# Patient Record
Sex: Female | Born: 1935 | Race: Black or African American | Hispanic: No | Marital: Single | Smoking: Never smoker
Health system: Southern US, Community
[De-identification: ages and names within clinical notes are randomized; demographics above are authoritative.]

## PROBLEM LIST (undated history)

## (undated) DIAGNOSIS — I429 Cardiomyopathy, unspecified: Secondary | ICD-10-CM

## (undated) DIAGNOSIS — I8390 Asymptomatic varicose veins of unspecified lower extremity: Secondary | ICD-10-CM

## (undated) DIAGNOSIS — I10 Essential (primary) hypertension: Secondary | ICD-10-CM

## (undated) DIAGNOSIS — E119 Type 2 diabetes mellitus without complications: Secondary | ICD-10-CM

## (undated) DIAGNOSIS — I509 Heart failure, unspecified: Secondary | ICD-10-CM

## (undated) HISTORY — PX: PARTIAL HYSTERECTOMY: SHX80

## (undated) HISTORY — PX: NECK SURGERY: SHX720

---

## 2006-03-22 ENCOUNTER — Ambulatory Visit: Payer: Self-pay | Admitting: Gastroenterology

## 2012-01-16 ENCOUNTER — Encounter (INDEPENDENT_AMBULATORY_CARE_PROVIDER_SITE_OTHER): Payer: Self-pay

## 2016-10-16 DIAGNOSIS — H401131 Primary open-angle glaucoma, bilateral, mild stage: Secondary | ICD-10-CM | POA: Diagnosis not present

## 2016-10-23 DIAGNOSIS — I493 Ventricular premature depolarization: Secondary | ICD-10-CM | POA: Diagnosis not present

## 2016-10-23 DIAGNOSIS — I5189 Other ill-defined heart diseases: Secondary | ICD-10-CM | POA: Diagnosis not present

## 2016-10-23 DIAGNOSIS — I517 Cardiomegaly: Secondary | ICD-10-CM | POA: Diagnosis not present

## 2016-10-23 DIAGNOSIS — I10 Essential (primary) hypertension: Secondary | ICD-10-CM | POA: Diagnosis not present

## 2016-11-27 DIAGNOSIS — I361 Nonrheumatic tricuspid (valve) insufficiency: Secondary | ICD-10-CM | POA: Diagnosis not present

## 2016-11-27 DIAGNOSIS — Z79899 Other long term (current) drug therapy: Secondary | ICD-10-CM | POA: Diagnosis not present

## 2016-11-27 DIAGNOSIS — I371 Nonrheumatic pulmonary valve insufficiency: Secondary | ICD-10-CM | POA: Diagnosis not present

## 2016-11-27 DIAGNOSIS — E782 Mixed hyperlipidemia: Secondary | ICD-10-CM | POA: Diagnosis not present

## 2016-11-27 DIAGNOSIS — I340 Nonrheumatic mitral (valve) insufficiency: Secondary | ICD-10-CM | POA: Diagnosis not present

## 2016-11-27 DIAGNOSIS — R7303 Prediabetes: Secondary | ICD-10-CM | POA: Diagnosis not present

## 2016-11-27 DIAGNOSIS — I10 Essential (primary) hypertension: Secondary | ICD-10-CM | POA: Diagnosis not present

## 2016-11-29 DIAGNOSIS — H401131 Primary open-angle glaucoma, bilateral, mild stage: Secondary | ICD-10-CM | POA: Diagnosis not present

## 2016-12-03 DIAGNOSIS — Z79899 Other long term (current) drug therapy: Secondary | ICD-10-CM | POA: Diagnosis not present

## 2016-12-03 DIAGNOSIS — R7303 Prediabetes: Secondary | ICD-10-CM | POA: Diagnosis not present

## 2016-12-03 DIAGNOSIS — Z1211 Encounter for screening for malignant neoplasm of colon: Secondary | ICD-10-CM | POA: Diagnosis not present

## 2016-12-03 DIAGNOSIS — E782 Mixed hyperlipidemia: Secondary | ICD-10-CM | POA: Diagnosis not present

## 2016-12-03 DIAGNOSIS — I10 Essential (primary) hypertension: Secondary | ICD-10-CM | POA: Diagnosis not present

## 2017-01-15 DIAGNOSIS — H401131 Primary open-angle glaucoma, bilateral, mild stage: Secondary | ICD-10-CM | POA: Diagnosis not present

## 2017-05-23 DIAGNOSIS — Z1231 Encounter for screening mammogram for malignant neoplasm of breast: Secondary | ICD-10-CM | POA: Diagnosis not present

## 2017-05-23 DIAGNOSIS — Z803 Family history of malignant neoplasm of breast: Secondary | ICD-10-CM | POA: Diagnosis not present

## 2017-06-03 DIAGNOSIS — R7303 Prediabetes: Secondary | ICD-10-CM | POA: Diagnosis not present

## 2017-06-03 DIAGNOSIS — E782 Mixed hyperlipidemia: Secondary | ICD-10-CM | POA: Diagnosis not present

## 2017-06-03 DIAGNOSIS — Z79899 Other long term (current) drug therapy: Secondary | ICD-10-CM | POA: Diagnosis not present

## 2017-06-03 DIAGNOSIS — I10 Essential (primary) hypertension: Secondary | ICD-10-CM | POA: Diagnosis not present

## 2017-06-13 DIAGNOSIS — Z79899 Other long term (current) drug therapy: Secondary | ICD-10-CM | POA: Diagnosis not present

## 2017-06-13 DIAGNOSIS — E782 Mixed hyperlipidemia: Secondary | ICD-10-CM | POA: Diagnosis not present

## 2017-06-13 DIAGNOSIS — Z0000 Encounter for general adult medical examination without abnormal findings: Secondary | ICD-10-CM | POA: Diagnosis not present

## 2017-06-13 DIAGNOSIS — I10 Essential (primary) hypertension: Secondary | ICD-10-CM | POA: Diagnosis not present

## 2017-06-13 DIAGNOSIS — R7303 Prediabetes: Secondary | ICD-10-CM | POA: Diagnosis not present

## 2017-08-24 DIAGNOSIS — Z23 Encounter for immunization: Secondary | ICD-10-CM | POA: Diagnosis not present

## 2017-09-06 DIAGNOSIS — H401131 Primary open-angle glaucoma, bilateral, mild stage: Secondary | ICD-10-CM | POA: Diagnosis not present

## 2017-10-15 DIAGNOSIS — H401112 Primary open-angle glaucoma, right eye, moderate stage: Secondary | ICD-10-CM | POA: Diagnosis not present

## 2017-11-12 DIAGNOSIS — H401112 Primary open-angle glaucoma, right eye, moderate stage: Secondary | ICD-10-CM | POA: Diagnosis not present

## 2017-12-12 DIAGNOSIS — E782 Mixed hyperlipidemia: Secondary | ICD-10-CM | POA: Diagnosis not present

## 2017-12-12 DIAGNOSIS — I10 Essential (primary) hypertension: Secondary | ICD-10-CM | POA: Diagnosis not present

## 2017-12-12 DIAGNOSIS — Z79899 Other long term (current) drug therapy: Secondary | ICD-10-CM | POA: Diagnosis not present

## 2017-12-12 DIAGNOSIS — R7303 Prediabetes: Secondary | ICD-10-CM | POA: Diagnosis not present

## 2017-12-19 DIAGNOSIS — E782 Mixed hyperlipidemia: Secondary | ICD-10-CM | POA: Diagnosis not present

## 2017-12-19 DIAGNOSIS — Z79899 Other long term (current) drug therapy: Secondary | ICD-10-CM | POA: Diagnosis not present

## 2017-12-19 DIAGNOSIS — I10 Essential (primary) hypertension: Secondary | ICD-10-CM | POA: Diagnosis not present

## 2017-12-19 DIAGNOSIS — R7303 Prediabetes: Secondary | ICD-10-CM | POA: Diagnosis not present

## 2018-01-06 DIAGNOSIS — E785 Hyperlipidemia, unspecified: Secondary | ICD-10-CM | POA: Diagnosis not present

## 2018-01-06 DIAGNOSIS — I517 Cardiomegaly: Secondary | ICD-10-CM | POA: Diagnosis not present

## 2018-01-06 DIAGNOSIS — I493 Ventricular premature depolarization: Secondary | ICD-10-CM | POA: Diagnosis not present

## 2018-01-06 DIAGNOSIS — I10 Essential (primary) hypertension: Secondary | ICD-10-CM | POA: Diagnosis not present

## 2018-01-31 DIAGNOSIS — R410 Disorientation, unspecified: Secondary | ICD-10-CM | POA: Diagnosis not present

## 2018-01-31 DIAGNOSIS — I8392 Asymptomatic varicose veins of left lower extremity: Secondary | ICD-10-CM | POA: Diagnosis not present

## 2018-02-01 ENCOUNTER — Emergency Department: Payer: Medicare Other

## 2018-02-01 ENCOUNTER — Inpatient Hospital Stay
Admission: EM | Admit: 2018-02-01 | Discharge: 2018-02-04 | DRG: 683 | Disposition: A | Discharge status: Home / Self Care | Payer: Medicare Other | Attending: Internal Medicine | Admitting: Internal Medicine

## 2018-02-01 ENCOUNTER — Other Ambulatory Visit: Payer: Self-pay

## 2018-02-01 ENCOUNTER — Encounter: Payer: Self-pay | Admitting: Emergency Medicine

## 2018-02-01 DIAGNOSIS — R531 Weakness: Secondary | ICD-10-CM | POA: Diagnosis not present

## 2018-02-01 DIAGNOSIS — E876 Hypokalemia: Secondary | ICD-10-CM | POA: Diagnosis present

## 2018-02-01 DIAGNOSIS — N390 Urinary tract infection, site not specified: Secondary | ICD-10-CM | POA: Diagnosis present

## 2018-02-01 DIAGNOSIS — I429 Cardiomyopathy, unspecified: Secondary | ICD-10-CM | POA: Diagnosis not present

## 2018-02-01 DIAGNOSIS — S0990XA Unspecified injury of head, initial encounter: Secondary | ICD-10-CM | POA: Diagnosis not present

## 2018-02-01 DIAGNOSIS — R413 Other amnesia: Secondary | ICD-10-CM | POA: Diagnosis present

## 2018-02-01 DIAGNOSIS — I130 Hypertensive heart and chronic kidney disease with heart failure and stage 1 through stage 4 chronic kidney disease, or unspecified chronic kidney disease: Secondary | ICD-10-CM | POA: Diagnosis not present

## 2018-02-01 DIAGNOSIS — I509 Heart failure, unspecified: Secondary | ICD-10-CM | POA: Diagnosis present

## 2018-02-01 DIAGNOSIS — J40 Bronchitis, not specified as acute or chronic: Secondary | ICD-10-CM | POA: Diagnosis not present

## 2018-02-01 DIAGNOSIS — M79604 Pain in right leg: Secondary | ICD-10-CM | POA: Diagnosis not present

## 2018-02-01 DIAGNOSIS — N189 Chronic kidney disease, unspecified: Secondary | ICD-10-CM | POA: Diagnosis present

## 2018-02-01 DIAGNOSIS — D61818 Other pancytopenia: Secondary | ICD-10-CM | POA: Diagnosis present

## 2018-02-01 DIAGNOSIS — R740 Nonspecific elevation of levels of transaminase and lactic acid dehydrogenase [LDH]: Secondary | ICD-10-CM | POA: Diagnosis present

## 2018-02-01 DIAGNOSIS — R4182 Altered mental status, unspecified: Secondary | ICD-10-CM | POA: Diagnosis not present

## 2018-02-01 DIAGNOSIS — E860 Dehydration: Secondary | ICD-10-CM | POA: Diagnosis present

## 2018-02-01 DIAGNOSIS — E119 Type 2 diabetes mellitus without complications: Secondary | ICD-10-CM | POA: Diagnosis not present

## 2018-02-01 DIAGNOSIS — E1122 Type 2 diabetes mellitus with diabetic chronic kidney disease: Secondary | ICD-10-CM | POA: Diagnosis present

## 2018-02-01 DIAGNOSIS — N179 Acute kidney failure, unspecified: Secondary | ICD-10-CM | POA: Diagnosis not present

## 2018-02-01 DIAGNOSIS — R161 Splenomegaly, not elsewhere classified: Secondary | ICD-10-CM

## 2018-02-01 DIAGNOSIS — R4189 Other symptoms and signs involving cognitive functions and awareness: Secondary | ICD-10-CM | POA: Diagnosis present

## 2018-02-01 DIAGNOSIS — K759 Inflammatory liver disease, unspecified: Secondary | ICD-10-CM

## 2018-02-01 DIAGNOSIS — S199XXA Unspecified injury of neck, initial encounter: Secondary | ICD-10-CM | POA: Diagnosis not present

## 2018-02-01 DIAGNOSIS — K760 Fatty (change of) liver, not elsewhere classified: Secondary | ICD-10-CM | POA: Diagnosis present

## 2018-02-01 HISTORY — DX: Asymptomatic varicose veins of unspecified lower extremity: I83.90

## 2018-02-01 HISTORY — DX: Cardiomyopathy, unspecified: I42.9

## 2018-02-01 HISTORY — DX: Heart failure, unspecified: I50.9

## 2018-02-01 HISTORY — DX: Type 2 diabetes mellitus without complications: E11.9

## 2018-02-01 HISTORY — DX: Essential (primary) hypertension: I10

## 2018-02-01 LAB — CBC
HEMATOCRIT: 37.1 % (ref 35.0–47.0)
Hemoglobin: 12.2 g/dL (ref 12.0–16.0)
MCH: 30.2 pg (ref 26.0–34.0)
MCHC: 32.9 g/dL (ref 32.0–36.0)
MCV: 91.7 fL (ref 80.0–100.0)
Platelets: 98 10*3/uL — ABNORMAL LOW (ref 150–440)
RBC: 4.05 MIL/uL (ref 3.80–5.20)
RDW: 15 % — AB (ref 11.5–14.5)
WBC: 3.3 10*3/uL — ABNORMAL LOW (ref 3.6–11.0)

## 2018-02-01 LAB — URINALYSIS, COMPLETE (UACMP) WITH MICROSCOPIC
Bilirubin Urine: NEGATIVE
Glucose, UA: NEGATIVE mg/dL
HGB URINE DIPSTICK: NEGATIVE
Ketones, ur: NEGATIVE mg/dL
Leukocytes, UA: NEGATIVE
NITRITE: NEGATIVE
PROTEIN: NEGATIVE mg/dL
SPECIFIC GRAVITY, URINE: 1.01 (ref 1.005–1.030)
pH: 5 (ref 5.0–8.0)

## 2018-02-01 LAB — BASIC METABOLIC PANEL
ANION GAP: 10 (ref 5–15)
BUN: 30 mg/dL — AB (ref 6–20)
CALCIUM: 8.6 mg/dL — AB (ref 8.9–10.3)
CO2: 22 mmol/L (ref 22–32)
Chloride: 103 mmol/L (ref 101–111)
Creatinine, Ser: 2.53 mg/dL — ABNORMAL HIGH (ref 0.44–1.00)
GFR, EST AFRICAN AMERICAN: 20 mL/min — AB (ref >60)
GFR, EST NON AFRICAN AMERICAN: 17 mL/min — AB (ref >60)
Glucose, Bld: 157 mg/dL — ABNORMAL HIGH (ref 65–99)
POTASSIUM: 3.5 mmol/L (ref 3.5–5.1)
SODIUM: 135 mmol/L (ref 135–145)

## 2018-02-01 LAB — TROPONIN I

## 2018-02-01 MED ORDER — SODIUM CHLORIDE 0.9 % IV BOLUS
1000.0000 mL | Freq: Once | INTRAVENOUS | Status: AC
  Administered 2018-02-01: 1000 mL via INTRAVENOUS

## 2018-02-01 MED ORDER — SODIUM CHLORIDE 0.9 % IV SOLN
1.0000 g | INTRAVENOUS | Status: DC
  Administered 2018-02-01: 1 g via INTRAVENOUS
  Filled 2018-02-01: qty 1
  Filled 2018-02-01: qty 10

## 2018-02-01 MED ORDER — ONDANSETRON HCL 4 MG/2ML IJ SOLN: 4.0000 mg | Freq: Four times a day (QID) | INTRAMUSCULAR | Status: DC | PRN

## 2018-02-01 MED ORDER — METOPROLOL TARTRATE 25 MG PO TABS
25.0000 mg | ORAL_TABLET | Freq: Two times a day (BID) | ORAL | Status: DC
  Administered 2018-02-02 – 2018-02-04 (×5): 25 mg via ORAL
  Filled 2018-02-01 (×5): qty 1

## 2018-02-01 MED ORDER — AMLODIPINE BESYLATE 5 MG PO TABS
5.0000 mg | ORAL_TABLET | Freq: Every day | ORAL | Status: DC
  Administered 2018-02-02 – 2018-02-04 (×3): 5 mg via ORAL
  Filled 2018-02-01 (×3): qty 1

## 2018-02-01 MED ORDER — HEPARIN SODIUM (PORCINE) 5000 UNIT/ML IJ SOLN
5000.0000 [IU] | Freq: Three times a day (TID) | INTRAMUSCULAR | Status: DC
  Administered 2018-02-01 – 2018-02-03 (×5): 5000 [IU] via SUBCUTANEOUS
  Filled 2018-02-01 (×5): qty 1

## 2018-02-01 MED ORDER — INSULIN ASPART 100 UNIT/ML ~~LOC~~ SOLN
0.0000 [IU] | Freq: Three times a day (TID) | SUBCUTANEOUS | Status: DC
  Filled 2018-02-01: qty 1

## 2018-02-01 MED ORDER — ASPIRIN EC 81 MG PO TBEC
81.0000 mg | DELAYED_RELEASE_TABLET | Freq: Every day | ORAL | Status: DC
  Administered 2018-02-01 – 2018-02-04 (×4): 81 mg via ORAL
  Filled 2018-02-01 (×4): qty 1

## 2018-02-01 MED ORDER — BISACODYL 10 MG RE SUPP
10.0000 mg | Freq: Every day | RECTAL | Status: DC | PRN
  Filled 2018-02-01: qty 1

## 2018-02-01 MED ORDER — ACETAMINOPHEN 325 MG PO TABS
650.0000 mg | ORAL_TABLET | Freq: Four times a day (QID) | ORAL | Status: DC | PRN
  Administered 2018-02-02 – 2018-02-03 (×2): 650 mg via ORAL
  Filled 2018-02-01 (×2): qty 2

## 2018-02-01 MED ORDER — PANTOPRAZOLE SODIUM 40 MG PO TBEC
40.0000 mg | DELAYED_RELEASE_TABLET | Freq: Every day | ORAL | Status: DC
  Administered 2018-02-01 – 2018-02-04 (×4): 40 mg via ORAL
  Filled 2018-02-01 (×4): qty 1

## 2018-02-01 MED ORDER — ACETAMINOPHEN 650 MG RE SUPP: 650.0000 mg | Freq: Four times a day (QID) | RECTAL | Status: DC | PRN

## 2018-02-01 MED ORDER — ONDANSETRON HCL 4 MG PO TABS: 4.0000 mg | ORAL_TABLET | Freq: Four times a day (QID) | ORAL | Status: DC | PRN

## 2018-02-01 MED ORDER — SODIUM CHLORIDE 0.9 % IV SOLN
Freq: Once | INTRAVENOUS | Status: AC
  Administered 2018-02-01: 18:00:00 via INTRAVENOUS

## 2018-02-01 MED ORDER — DOCUSATE SODIUM 100 MG PO CAPS
100.0000 mg | ORAL_CAPSULE | Freq: Two times a day (BID) | ORAL | Status: DC
  Administered 2018-02-01 – 2018-02-04 (×6): 100 mg via ORAL
  Filled 2018-02-01 (×6): qty 1

## 2018-02-01 MED ORDER — POTASSIUM CHLORIDE IN NACL 20-0.9 MEQ/L-% IV SOLN
INTRAVENOUS | Status: AC
  Administered 2018-02-01 – 2018-02-02 (×2): via INTRAVENOUS
  Filled 2018-02-01 (×3): qty 1000

## 2018-02-01 NOTE — H&P (Signed)
History and Physical    Melissa Vazquez ZOX:[id number] DOB: March 03, 1936 DOA: 02/01/2018  Referring physician: Dr. Roxan Hockey PCP: [facility]  Specialists: none  Chief Complaint: confusion and weakness  HPI: Melissa Vazquez is a 82 y.o. female has a past medical history significant for DM and HTN now with worsening confusion with weakness and anorexia, In ER, pt noted to have AKI with low GFR. Also with possible UTI. Head CT OK. Sugars and BP stable. She is now admiited. Denies CP or SOB. No N/V/D.  Review of Systems: The patient denies , fever, weight loss,, vision loss, decreased hearing, hoarseness, chest pain, syncope, dyspnea on exertion, peripheral edema, balance deficits, hemoptysis, abdominal pain, melena, hematochezia, severe indigestion/heartburn, hematuria, incontinence, genital sores, muscle weakness, suspicious skin lesions, transient blindness, difficulty walking, depression, unusual weight change, abnormal bleeding, enlarged lymph nodes, angioedema, and breast masses.   Past Medical History:  Diagnosis Date  . Cardiomyopathy (HCC)   . CHF (congestive heart failure) (HCC)   . Diabetes mellitus without complication (HCC)    prediabetic  . Hypertension   . Varicose vein of leg    Past Surgical History:  Procedure Laterality Date  . NECK SURGERY     Social History:  reports that she has never smoked. She has never used smokeless tobacco. She reports that she does not drink alcohol or use drugs.  No Known Allergies  History reviewed. No pertinent family history.  Prior to Admission medications   Medication Sig Start Date End Date Taking? Authorizing Provider  amLODipine (NORVASC) 5 MG tablet Take 5 mg by mouth daily. 12/27/17  Yes [provider]  metoprolol tartrate (LOPRESSOR) 25 MG tablet Take 25 mg by mouth 2 (two) times daily. 12/27/17  Yes [provider]  potassium chloride (K-DUR) 10 MEQ tablet Take 10 mEq by mouth daily.  12/27/17  Yes [provider]  triamterene-hydrochlorothiazide (MAXZIDE-25) 37.5-25 MG tablet Take 1 tablet by mouth daily. 12/27/17  Yes [provider]   Physical Exam: Vitals:   02/01/18 1600 02/01/18 1630 02/01/18 1738 02/01/18 1800  BP: (!) 133/58 (!) 130/58 (!) 125/58 126/64  Pulse: 99  100 89  Resp: 20 20 (!) 27 15  Temp:      TempSrc:      SpO2: 100%  93% 97%  Weight:      Height:         General:  No apparent distress, WDWN, [place]/AT  Eyes: PERRL, EOMI, no scleral icterus, conjunctiva clear  ENT: moist oropharynx without exudate, TM's benign, dentition fair  Neck: supple, no lymphadenopathy. No bruits or thyromegaly  Cardiovascular: regular rate without MRG; 2+ peripheral pulses, no JVD, no peripheral edema  Respiratory: CTA biL, good air movement without wheezing, rhonchi or crackled. Respiratory effort normal  Abdomen: soft, non tender to palpation, positive bowel sounds, no guarding, no rebound  Skin: no rashes or lesions  Musculoskeletal: normal bulk and tone, no joint swelling  Psychiatric: normal mood and affect, A&OX1(not to time or place)  Neurologic: CN 2-12 grossly intact, Motor strength 5/5 in all 4 groups with symmetric DTR's and non-focal sensory exam  Labs on Admission:  Basic Metabolic Panel: Recent Labs  Lab 02/01/18 1144  NA 135  K 3.5  CL 103  CO2 22  GLUCOSE 157*  BUN 30*  CREATININE 2.53*  CALCIUM 8.6*   Liver Function Tests: No results for input(s): AST, ALT, ALKPHOS, BILITOT, PROT, ALBUMIN in the last 168 hours. No results  for input(s): LIPASE, AMYLASE in the last 168 hours. No results for input(s): AMMONIA in the last 168 hours. CBC: Recent Labs  Lab 02/01/18 1144  WBC 3.3*  HGB 12.2  HCT 37.1  MCV 91.7  PLT 98*   Cardiac Enzymes: Recent Labs  Lab 02/01/18 1144  TROPONINI <0.03    BNP (last 3 results) No results for input(s): BNP in the last 8760 hours.  ProBNP (last 3 results) No results for  input(s): PROBNP in the last 8760 hours.  CBG: No results for input(s): GLUCAP in the last 168 hours.  Radiological Exams on Admission: Dg Chest 2 View  Result Date: 02/01/2018 CLINICAL DATA:  Increasing weakness for 3 days, unable to stand, history CHF, hypertension EXAM: CHEST - 2 VIEW COMPARISON:  None FINDINGS: Normal heart size, mediastinal contours, and pulmonary vascularity. Atherosclerotic calcification aorta. Mild central peribronchial thickening. Lungs otherwise clear. No infiltrate, pleural effusion or pneumothorax. Multilevel endplate spur formation thoracic spine. IMPRESSION: Mild bronchitic changes without acute infiltrate. Electronically Signed   By: Ulyses Southward M.D.   On: 02/01/2018 13:38   Ct Head Wo Contrast  Result Date: 02/01/2018 CLINICAL DATA:  82 year old female with head and neck injury following fall several nights ago. Initial encounter. EXAM: CT HEAD WITHOUT CONTRAST CT CERVICAL SPINE WITHOUT CONTRAST TECHNIQUE: Multidetector CT imaging of the head and cervical spine was performed following the standard protocol without intravenous contrast. Multiplanar CT image reconstructions of the cervical spine were also generated. COMPARISON:  None. FINDINGS: CT HEAD FINDINGS Brain: No evidence of acute infarction, hemorrhage, hydrocephalus, extra-axial collection or mass lesion/mass effect. Minimal chronic small-vessel white matter ischemic changes noted. Vascular: Atherosclerotic calcifications identified. Skull: No acute abnormality Sinuses/Orbits: No acute abnormality Other: None CT CERVICAL SPINE FINDINGS Alignment: Normal. Skull base and vertebrae: No acute fracture. No primary bone lesion or focal pathologic process. Soft tissues and spinal canal: No prevertebral fluid or swelling. No visible canal hematoma. Disc levels: Moderate multilevel degenerative disc disease and spondylosis throughout the cervical spine noted contributing to central spinal and bony foraminal narrowing at  multiple levels. Upper chest: No acute abnormality Other: None IMPRESSION: 1. No evidence of acute intracranial abnormality. Minimal chronic small-vessel white matter ischemic changes. 2. No static evidence of acute injury to the cervical spine. Moderate multilevel degenerative disc disease/spondylosis. Electronically Signed   By: Harmon Pier M.D.   On: 02/01/2018 17:33   Ct Cervical Spine Wo Contrast  Result Date: 02/01/2018 CLINICAL DATA:  82 year old female with head and neck injury following fall several nights ago. Initial encounter. EXAM: CT HEAD WITHOUT CONTRAST CT CERVICAL SPINE WITHOUT CONTRAST TECHNIQUE: Multidetector CT imaging of the head and cervical spine was performed following the standard protocol without intravenous contrast. Multiplanar CT image reconstructions of the cervical spine were also generated. COMPARISON:  None. FINDINGS: CT HEAD FINDINGS Brain: No evidence of acute infarction, hemorrhage, hydrocephalus, extra-axial collection or mass lesion/mass effect. Minimal chronic small-vessel white matter ischemic changes noted. Vascular: Atherosclerotic calcifications identified. Skull: No acute abnormality Sinuses/Orbits: No acute abnormality Other: None CT CERVICAL SPINE FINDINGS Alignment: Normal. Skull base and vertebrae: No acute fracture. No primary bone lesion or focal pathologic process. Soft tissues and spinal canal: No prevertebral fluid or swelling. No visible canal hematoma. Disc levels: Moderate multilevel degenerative disc disease and spondylosis throughout the cervical spine noted contributing to central spinal and bony foraminal narrowing at multiple levels. Upper chest: No acute abnormality Other: None IMPRESSION: 1. No evidence of acute intracranial abnormality. Minimal chronic small-vessel white matter  ischemic changes. 2. No static evidence of acute injury to the cervical spine. Moderate multilevel degenerative disc disease/spondylosis. Electronically Signed   By: Harmon Pier M.D.   On: 02/01/2018 17:33   US Venous Img Lower Unilateral Right  Result Date: 02/01/2018 CLINICAL DATA:  RIGHT leg pain for 2 months EXAM: RIGHT LOWER EXTREMITY VENOUS DOPPLER ULTRASOUND TECHNIQUE: Gray-scale sonography with graded compression, as well as color Doppler and duplex ultrasound were performed to evaluate the lower extremity deep venous systems from the level of the common femoral vein and including the common femoral, femoral, profunda femoral, popliteal and calf veins including the posterior tibial, peroneal and gastrocnemius veins when visible. The superficial great saphenous vein was also interrogated. Spectral Doppler was utilized to evaluate flow at rest and with distal augmentation maneuvers in the common femoral, femoral and popliteal veins. COMPARISON:  None FINDINGS: Contralateral Common Femoral Vein: Respiratory phasicity is normal and symmetric with the symptomatic side. No evidence of thrombus. Normal compressibility. Common Femoral Vein: No evidence of thrombus. Normal compressibility, respiratory phasicity and response to augmentation. Saphenofemoral Junction: No evidence of thrombus. Normal compressibility and flow on color Doppler imaging. Profunda Femoral Vein: No evidence of thrombus. Normal compressibility and flow on color Doppler imaging. Femoral Vein: No evidence of thrombus. Normal compressibility, respiratory phasicity and response to augmentation. Popliteal Vein: No evidence of thrombus. Normal compressibility, respiratory phasicity and response to augmentation. Calf Veins: No evidence of thrombus. Normal compressibility and flow on color Doppler imaging. Superficial Great Saphenous Vein: No evidence of thrombus. Normal compressibility. Venous Reflux:  None. Other Findings:  None. IMPRESSION: No evidence of deep venous thrombosis in the RIGHT lower extremity. Electronically Signed   By: Ulyses Southward M.D.   On: 02/01/2018 13:58    EKG: Independently  reviewed.  Assessment/Plan Principal Problem:   AKI (acute kidney injury) (HCC) Active Problems:   Altered mental status, unspecified   UTI (urinary tract infection)   Diabetes (HCC)   Will observe on floor with IV fluids and empiric IV ABX. Cultures sent. Follow BP and sugars. Renal US ordered. Consult Neurology. Repeat labs in AM.  Diet: soft Fluids: NS@75  DVT Prophylaxis: SQ Heparin  Code Status: FULL  Family Communication: yes  Disposition Plan: home  Time spent: 50 min

## 2018-02-01 NOTE — ED Triage Notes (Signed)
Arrives c/o 3 day history of decreased appetite and 2 day history of fatigue.  Patient has history of HTN, and son reports that patient's BP this morning was 101/58.  Patient took BP meds yesterday.

## 2018-02-01 NOTE — ED Notes (Signed)
Report from alicia, rn.  

## 2018-02-01 NOTE — ED Provider Notes (Signed)
[facility] Emergency Department Provider Note    First MD Initiated Contact with Patient 02/01/18 1242     (approximate)  I have reviewed the triage vital signs and the nursing notes.   HISTORY  Chief Complaint confusion   HPI Melissa Vazquez is a 82 y.o. female presents to the ER with history of congestive heart failure as well as diabetes and hypertension with recent escalation of blood pressure medications with worsening weakness drowsiness and confusion.  Family at bedside states that where the patient's blood pressure was too low it was down in the low 100s and she typically runs in the 140s 150s.  Patient denies any pain.  They deny any recent trauma.  No fevers.  No abdominal pain.  No narcotic medications.  No history of dementia or stroke.  Past Medical History:  Diagnosis Date  . Cardiomyopathy (HCC)   . CHF (congestive heart failure) (HCC)   . Diabetes mellitus without complication (HCC)    prediabetic  . Hypertension   . Varicose vein of leg    No family history on file. Past Surgical History:  Procedure Laterality Date  . NECK SURGERY     There are no active problems to display for this patient.     Prior to Admission medications   Not on File    Allergies Patient has no known allergies.    Social History Social History   Tobacco Use  . Smoking status: Never Smoker  . Smokeless tobacco: Never Used  Substance Use Topics  . Alcohol use: Never    Frequency: Never  . Drug use: Never    Review of Systems Patient denies headaches, rhinorrhea, blurry vision, numbness, shortness of breath, chest pain, edema, cough, abdominal pain, nausea, vomiting, diarrhea, dysuria, fevers, rashes or hallucinations unless otherwise stated above in HPI. ____________________________________________   PHYSICAL EXAM:  VITAL SIGNS: Vitals:   02/01/18 1136  BP: (!) 109/44  Pulse: (!) 104  Resp: 16  Temp: 98.8 F (37.1 C)  SpO2: 96%     Constitutional: drowsy, frail appearing but in no acute distress. Eyes: Conjunctivae are normal.  Head: Atraumatic. Nose: No congestion/rhinnorhea. Mouth/Throat: Mucous membranes are moist.   Neck: No stridor. Painless ROM.  Cardiovascular: Normal rate, regular rhythm. Grossly normal heart sounds.  Good peripheral circulation. Respiratory: Normal respiratory effort.  No retractions. Lungs CTAB. Gastrointestinal: Soft and nontender. No distention. No abdominal bruits. No CVA tenderness. Genitourinary:  Musculoskeletal: No lower extremity tenderness nor edema.  No joint effusions. Neurologic:  Normal speech and language. No gross focal neurologic deficits are appreciated. No facial droop Skin:  Skin is warm, dry and intact. No rash noted.  ____________________________________________   LABS (all labs ordered are listed, but only abnormal results are displayed)  Results for orders placed or performed during the hospital encounter of 02/01/18 (from the past 24 hour(s))  Basic metabolic panel     Status: Abnormal   Collection Time: 02/01/18 11:44 AM  Result Value Ref Range   Sodium 135 135 - 145 mmol/L   Potassium 3.5 3.5 - 5.1 mmol/L   Chloride 103 101 - 111 mmol/L   CO2 22 22 - 32 mmol/L   Glucose, Bld 157 (H) 65 - 99 mg/dL   BUN 30 (H) 6 - 20 mg/dL   Creatinine, Ser 8.29 (H) 0.44 - 1.00 mg/dL   Calcium 8.6 (L) 8.9 - 10.3 mg/dL   GFR calc non Af Amer 17 (L) >60 mL/min   GFR calc  Af Amer 20 (L) >60 mL/min   Anion gap 10 5 - 15  CBC     Status: Abnormal   Collection Time: 02/01/18 11:44 AM  Result Value Ref Range   WBC 3.3 (L) 3.6 - 11.0 K/uL   RBC 4.05 3.80 - 5.20 MIL/uL   Hemoglobin 12.2 12.0 - 16.0 g/dL   HCT 16.1 09.6 - 04.5 %   MCV 91.7 80.0 - 100.0 fL   MCH 30.2 26.0 - 34.0 pg   MCHC 32.9 32.0 - 36.0 g/dL   RDW 40.9 (H) 81.1 - 91.4 %   Platelets 98 (L) 150 - 440 K/uL  Troponin I     Status: None   Collection Time: 02/01/18 11:44 AM  Result Value Ref Range    Troponin I <0.03 <0.03 ng/mL   ____________________________________________  EKG My review and personal interpretation at Time: 14:17   Indication: weakness  Rate: 90  Rhythm: sinus Axis: normal Other: normal intervals, no stemi, occasional pvc ____________________________________________  RADIOLOGY  I personally reviewed all radiographic images ordered to evaluate for the above acute complaints and reviewed radiology reports and findings.  These findings were personally discussed with the patient.  Please see medical record for radiology report.  ____________________________________________   PROCEDURES  Procedure(s) performed:  Procedures    Critical Care performed: no ____________________________________________   INITIAL IMPRESSION / ASSESSMENT AND PLAN / ED COURSE  Pertinent labs & imaging results that were available during my care of the patient were reviewed by me and considered in my medical decision making (see chart for details).  DDX: Dehydration, sepsis, pna, uti, hypoglycemia, cva, drug effect, withdrawal, encephalitis   Melissa Vazquez is a 82 y.o. who presents to the ED with symptoms as described above.  Very vague symptoms with no lateralizing features on exam.  Blood work will be sent for the above differential.  Chest x-ray shows no evidence of pneumonia.  Will provide IV fluids as her laboratory data does show evidence of CKD.  Uncertain of chronicity of this.  Certainly possible this is acute but I cannot confirm this.  Patient denies any pain or nausea.  Clinical Course as of Feb 01 2034  Sat Feb 01, 2018  1610 Patient reassessed and still drowsy and encephalopathic.  Awaiting urine.  Family now states that they did remember her falling 2 days ago but denied hitting her head.  Due to her encephalopathy will order CT imaging.   [PR]  1840 Does have rare bacteria but no nitrites or leukocytes.  Patient still drowsy and fall asleep.  Seems primarily  metabolic.  Might be secondary to possible AKI uncertain as unable to obtain baseline.  Patient does feel very weak with standing.  No evidence of head injury.  Seems less likely CVA.  May be secondary to hypotension for her as she does typically run in the 150s.   [PR]    Clinical Course User Index [PR] Willy Eddy, MD     As part of my medical decision making, I reviewed the following data within the electronic MEDICAL RECORD NUMBER Nursing notes reviewed and incorporated, Labs reviewed, notes from prior ED visits and [place] Controlled Substance Database   ____________________________________________   FINAL CLINICAL IMPRESSION(S) / ED DIAGNOSES  Final diagnoses:  Weakness  Altered mental status, unspecified altered mental status type      NEW MEDICATIONS STARTED DURING THIS VISIT:  New Prescriptions   No medications on file     Note:  This document was  prepared using Conservation officer, historic buildings and may include unintentional dictation errors.    Willy Eddy, MD 02/01/18 2038

## 2018-02-02 ENCOUNTER — Observation Stay: Payer: Medicare Other

## 2018-02-02 ENCOUNTER — Inpatient Hospital Stay: Payer: Medicare Other

## 2018-02-02 DIAGNOSIS — N179 Acute kidney failure, unspecified: Secondary | ICD-10-CM | POA: Diagnosis not present

## 2018-02-02 DIAGNOSIS — R740 Nonspecific elevation of levels of transaminase and lactic acid dehydrogenase [LDH]: Secondary | ICD-10-CM | POA: Diagnosis present

## 2018-02-02 DIAGNOSIS — R509 Fever, unspecified: Secondary | ICD-10-CM | POA: Diagnosis not present

## 2018-02-02 DIAGNOSIS — N390 Urinary tract infection, site not specified: Secondary | ICD-10-CM | POA: Diagnosis present

## 2018-02-02 DIAGNOSIS — I509 Heart failure, unspecified: Secondary | ICD-10-CM | POA: Diagnosis present

## 2018-02-02 DIAGNOSIS — D696 Thrombocytopenia, unspecified: Secondary | ICD-10-CM | POA: Diagnosis not present

## 2018-02-02 DIAGNOSIS — M79604 Pain in right leg: Secondary | ICD-10-CM | POA: Diagnosis present

## 2018-02-02 DIAGNOSIS — R4182 Altered mental status, unspecified: Secondary | ICD-10-CM | POA: Diagnosis not present

## 2018-02-02 DIAGNOSIS — E860 Dehydration: Secondary | ICD-10-CM | POA: Diagnosis not present

## 2018-02-02 DIAGNOSIS — K760 Fatty (change of) liver, not elsewhere classified: Secondary | ICD-10-CM | POA: Diagnosis present

## 2018-02-02 DIAGNOSIS — I429 Cardiomyopathy, unspecified: Secondary | ICD-10-CM | POA: Diagnosis present

## 2018-02-02 DIAGNOSIS — R410 Disorientation, unspecified: Secondary | ICD-10-CM | POA: Diagnosis not present

## 2018-02-02 DIAGNOSIS — N189 Chronic kidney disease, unspecified: Secondary | ICD-10-CM | POA: Diagnosis present

## 2018-02-02 DIAGNOSIS — R531 Weakness: Secondary | ICD-10-CM | POA: Diagnosis not present

## 2018-02-02 DIAGNOSIS — R413 Other amnesia: Secondary | ICD-10-CM | POA: Diagnosis present

## 2018-02-02 DIAGNOSIS — R4189 Other symptoms and signs involving cognitive functions and awareness: Secondary | ICD-10-CM | POA: Diagnosis present

## 2018-02-02 DIAGNOSIS — G9340 Encephalopathy, unspecified: Secondary | ICD-10-CM | POA: Diagnosis not present

## 2018-02-02 DIAGNOSIS — E876 Hypokalemia: Secondary | ICD-10-CM | POA: Diagnosis present

## 2018-02-02 DIAGNOSIS — D61818 Other pancytopenia: Secondary | ICD-10-CM | POA: Diagnosis present

## 2018-02-02 DIAGNOSIS — E119 Type 2 diabetes mellitus without complications: Secondary | ICD-10-CM | POA: Diagnosis not present

## 2018-02-02 DIAGNOSIS — J40 Bronchitis, not specified as acute or chronic: Secondary | ICD-10-CM | POA: Diagnosis present

## 2018-02-02 DIAGNOSIS — I130 Hypertensive heart and chronic kidney disease with heart failure and stage 1 through stage 4 chronic kidney disease, or unspecified chronic kidney disease: Secondary | ICD-10-CM | POA: Diagnosis present

## 2018-02-02 DIAGNOSIS — K7689 Other specified diseases of liver: Secondary | ICD-10-CM | POA: Diagnosis not present

## 2018-02-02 DIAGNOSIS — E1122 Type 2 diabetes mellitus with diabetic chronic kidney disease: Secondary | ICD-10-CM | POA: Diagnosis present

## 2018-02-02 DIAGNOSIS — D649 Anemia, unspecified: Secondary | ICD-10-CM | POA: Diagnosis not present

## 2018-02-02 LAB — GLUCOSE, CAPILLARY
GLUCOSE-CAPILLARY: 124 mg/dL — AB (ref 65–99)
Glucose-Capillary: 86 mg/dL (ref 65–99)

## 2018-02-02 LAB — CBC
HCT: 31.6 % — ABNORMAL LOW (ref 35.0–47.0)
Hemoglobin: 10.8 g/dL — ABNORMAL LOW (ref 12.0–16.0)
MCH: 30.8 pg (ref 26.0–34.0)
MCHC: 34.3 g/dL (ref 32.0–36.0)
MCV: 89.8 fL (ref 80.0–100.0)
Platelets: 72 10*3/uL — ABNORMAL LOW (ref 150–440)
RBC: 3.52 MIL/uL — AB (ref 3.80–5.20)
RDW: 15.2 % — ABNORMAL HIGH (ref 11.5–14.5)
WBC: 3.5 10*3/uL — ABNORMAL LOW (ref 3.6–11.0)

## 2018-02-02 LAB — COMPREHENSIVE METABOLIC PANEL
ALK PHOS: 69 U/L (ref 38–126)
ALT: 104 U/L — ABNORMAL HIGH (ref 14–54)
AST: 125 U/L — ABNORMAL HIGH (ref 15–41)
Albumin: 2.9 g/dL — ABNORMAL LOW (ref 3.5–5.0)
Anion gap: 6 (ref 5–15)
BUN: 29 mg/dL — ABNORMAL HIGH (ref 6–20)
CALCIUM: 7.7 mg/dL — AB (ref 8.9–10.3)
CO2: 23 mmol/L (ref 22–32)
Chloride: 108 mmol/L (ref 101–111)
Creatinine, Ser: 1.82 mg/dL — ABNORMAL HIGH (ref 0.44–1.00)
GFR calc Af Amer: 29 mL/min — ABNORMAL LOW (ref >60)
GFR calc non Af Amer: 25 mL/min — ABNORMAL LOW (ref >60)
Glucose, Bld: 94 mg/dL (ref 65–99)
Potassium: 3.4 mmol/L — ABNORMAL LOW (ref 3.5–5.1)
SODIUM: 137 mmol/L (ref 135–145)
Total Bilirubin: 0.6 mg/dL (ref 0.3–1.2)
Total Protein: 5.8 g/dL — ABNORMAL LOW (ref 6.5–8.1)

## 2018-02-02 LAB — AMMONIA: Ammonia: 40 umol/L — ABNORMAL HIGH (ref 9–35)

## 2018-02-02 LAB — VITAMIN B12: VITAMIN B 12: 1085 pg/mL — AB (ref 180–914)

## 2018-02-02 LAB — MAGNESIUM: Magnesium: 2.1 mg/dL (ref 1.7–2.4)

## 2018-02-02 LAB — PROCALCITONIN: PROCALCITONIN: 0.56 ng/mL

## 2018-02-02 LAB — TSH: TSH: 2.069 u[IU]/mL (ref 0.350–4.500)

## 2018-02-02 LAB — FOLATE: FOLATE: 37 ng/mL (ref >5.9)

## 2018-02-02 MED ORDER — POTASSIUM CHLORIDE 20 MEQ PO PACK
40.0000 meq | PACK | Freq: Once | ORAL | Status: AC
  Administered 2018-02-02: 09:00:00 40 meq via ORAL
  Filled 2018-02-02: qty 2

## 2018-02-02 NOTE — Progress Notes (Signed)
[employer] - [place] at [facility]   PATIENT NAME: Melissa Vazquez    MR#:  [id number]  DATE OF BIRTH:  17-Mar-1936  SUBJECTIVE:  CHIEF COMPLAINT:  No chief complaint on file. Patient with poor memory, numerous family members at the bedside  REVIEW OF SYSTEMS:  CONSTITUTIONAL: No fever, fatigue or weakness.  EYES: No blurred or double vision.  EARS, NOSE, AND THROAT: No tinnitus or ear pain.  RESPIRATORY: No cough, shortness of breath, wheezing or hemoptysis.  CARDIOVASCULAR: No chest pain, orthopnea, edema.  GASTROINTESTINAL: No nausea, vomiting, diarrhea or abdominal pain.  GENITOURINARY: No dysuria, hematuria.  ENDOCRINE: No polyuria, nocturia,  HEMATOLOGY: No anemia, easy bruising or bleeding SKIN: No rash or lesion. MUSCULOSKELETAL: No joint pain or arthritis.   NEUROLOGIC: No tingling, numbness, weakness.  PSYCHIATRY: No anxiety or depression.   ROS  DRUG ALLERGIES:  No Known Allergies  VITALS:  Blood pressure 130/62, pulse 94, temperature 98.5 F (36.9 C), temperature source Oral, resp. rate (!) 22, height 5' 5.5" (1.664 m), weight 83.9 kg (185 lb), SpO2 94 %.  PHYSICAL EXAMINATION:  GENERAL:  82 y.o.-year-old patient lying in the bed with no acute distress.  EYES: Pupils equal, round, reactive to light and accommodation. No scleral icterus. Extraocular muscles intact.  HEENT: Head atraumatic, normocephalic. Oropharynx and nasopharynx clear.  NECK:  Supple, no jugular venous distention. No thyroid enlargement, no tenderness.  LUNGS: Normal breath sounds bilaterally, no wheezing, rales,rhonchi or crepitation. No use of accessory muscles of respiration.  CARDIOVASCULAR: S1, S2 normal. No murmurs, rubs, or gallops.  ABDOMEN: Soft, nontender, nondistended. Bowel sounds present. No organomegaly or mass.  EXTREMITIES: No pedal edema, cyanosis, or clubbing.  NEUROLOGIC: Cranial nerves II through XII are intact. Muscle strength 5/5 in all extremities. Sensation  intact. Gait not checked.  PSYCHIATRIC: The patient is alert and oriented x 3.  SKIN: No obvious rash, lesion, or ulcer.   Physical Exam LABORATORY PANEL:   CBC Recent Labs  Lab 02/02/18 0452  WBC 3.5*  HGB 10.8*  HCT 31.6*  PLT 72*   ------------------------------------------------------------------------------------------------------------------  Chemistries  Recent Labs  Lab 02/02/18 0452 02/02/18 0847  NA 137  --   K 3.4*  --   CL 108  --   CO2 23  --   GLUCOSE 94  --   BUN 29*  --   CREATININE 1.82*  --   CALCIUM 7.7*  --   MG  --  2.1  AST 125*  --   ALT 104*  --   ALKPHOS 69  --   BILITOT 0.6  --    ------------------------------------------------------------------------------------------------------------------  Cardiac Enzymes Recent Labs  Lab 02/01/18 1144  TROPONINI <0.03   ------------------------------------------------------------------------------------------------------------------  RADIOLOGY:  Dg Chest 2 View  Result Date: 02/01/2018 CLINICAL DATA:  Increasing weakness for 3 days, unable to stand, history CHF, hypertension EXAM: CHEST - 2 VIEW COMPARISON:  None FINDINGS: Normal heart size, mediastinal contours, and pulmonary vascularity. Atherosclerotic calcification aorta. Mild central peribronchial thickening. Lungs otherwise clear. No infiltrate, pleural effusion or pneumothorax. Multilevel endplate spur formation thoracic spine. IMPRESSION: Mild bronchitic changes without acute infiltrate. Electronically Signed   By: Ulyses Southward M.D.   On: 02/01/2018 13:38   Ct Head Wo Contrast  Result Date: 02/01/2018 CLINICAL DATA:  82 year old female with head and neck injury following fall several nights ago. Initial encounter. EXAM: CT HEAD WITHOUT CONTRAST CT CERVICAL SPINE WITHOUT CONTRAST TECHNIQUE: Multidetector CT imaging of the head and cervical spine was performed following  the standard protocol without intravenous contrast. Multiplanar CT image  reconstructions of the cervical spine were also generated. COMPARISON:  None. FINDINGS: CT HEAD FINDINGS Brain: No evidence of acute infarction, hemorrhage, hydrocephalus, extra-axial collection or mass lesion/mass effect. Minimal chronic small-vessel white matter ischemic changes noted. Vascular: Atherosclerotic calcifications identified. Skull: No acute abnormality Sinuses/Orbits: No acute abnormality Other: None CT CERVICAL SPINE FINDINGS Alignment: Normal. Skull base and vertebrae: No acute fracture. No primary bone lesion or focal pathologic process. Soft tissues and spinal canal: No prevertebral fluid or swelling. No visible canal hematoma. Disc levels: Moderate multilevel degenerative disc disease and spondylosis throughout the cervical spine noted contributing to central spinal and bony foraminal narrowing at multiple levels. Upper chest: No acute abnormality Other: None IMPRESSION: 1. No evidence of acute intracranial abnormality. Minimal chronic small-vessel white matter ischemic changes. 2. No static evidence of acute injury to the cervical spine. Moderate multilevel degenerative disc disease/spondylosis. Electronically Signed   By: Harmon Pier M.D.   On: 02/01/2018 17:33   Ct Cervical Spine Wo Contrast  Result Date: 02/01/2018 CLINICAL DATA:  83 year old female with head and neck injury following fall several nights ago. Initial encounter. EXAM: CT HEAD WITHOUT CONTRAST CT CERVICAL SPINE WITHOUT CONTRAST TECHNIQUE: Multidetector CT imaging of the head and cervical spine was performed following the standard protocol without intravenous contrast. Multiplanar CT image reconstructions of the cervical spine were also generated. COMPARISON:  None. FINDINGS: CT HEAD FINDINGS Brain: No evidence of acute infarction, hemorrhage, hydrocephalus, extra-axial collection or mass lesion/mass effect. Minimal chronic small-vessel white matter ischemic changes noted. Vascular: Atherosclerotic calcifications identified.  Skull: No acute abnormality Sinuses/Orbits: No acute abnormality Other: None CT CERVICAL SPINE FINDINGS Alignment: Normal. Skull base and vertebrae: No acute fracture. No primary bone lesion or focal pathologic process. Soft tissues and spinal canal: No prevertebral fluid or swelling. No visible canal hematoma. Disc levels: Moderate multilevel degenerative disc disease and spondylosis throughout the cervical spine noted contributing to central spinal and bony foraminal narrowing at multiple levels. Upper chest: No acute abnormality Other: None IMPRESSION: 1. No evidence of acute intracranial abnormality. Minimal chronic small-vessel white matter ischemic changes. 2. No static evidence of acute injury to the cervical spine. Moderate multilevel degenerative disc disease/spondylosis. Electronically Signed   By: Harmon Pier M.D.   On: 02/01/2018 17:33   US Renal  Result Date: 02/02/2018 CLINICAL DATA:  Acute kidney injury, history cardiomyopathy, CHF, diabetes mellitus, hypertension EXAM: RENAL / URINARY TRACT ULTRASOUND COMPLETE COMPARISON:  None FINDINGS: Right Kidney: Length: 11.0 cm. Normal cortical thickness. Increased cortical echogenicity. Cysts identified, largest laterally 3.8 x 3.6 x 4.7 cm, simple in character. No hydronephrosis or solid mass. No definite shadowing calcification. Left Kidney: Length: 11.6 cm. Normal cortical thickness. Increased cortical echogenicity. Small cysts identified, largest at inferior pole 3.2 x 2.8 x 3.2 cm, simple in character. No solid mass, hydronephrosis or shadowing calcification. Bladder: Appears normal for degree of bladder distention. IMPRESSION: Medical renal disease changes of both kidneys. BILATERAL renal cysts. Electronically Signed   By: Ulyses Southward M.D.   On: 02/02/2018 08:37   US Venous Img Lower Unilateral Right  Result Date: 02/01/2018 CLINICAL DATA:  RIGHT leg pain for 2 months EXAM: RIGHT LOWER EXTREMITY VENOUS DOPPLER ULTRASOUND TECHNIQUE: Gray-scale  sonography with graded compression, as well as color Doppler and duplex ultrasound were performed to evaluate the lower extremity deep venous systems from the level of the common femoral vein and including the common femoral, femoral, profunda femoral, popliteal and calf  veins including the posterior tibial, peroneal and gastrocnemius veins when visible. The superficial great saphenous vein was also interrogated. Spectral Doppler was utilized to evaluate flow at rest and with distal augmentation maneuvers in the common femoral, femoral and popliteal veins. COMPARISON:  None FINDINGS: Contralateral Common Femoral Vein: Respiratory phasicity is normal and symmetric with the symptomatic side. No evidence of thrombus. Normal compressibility. Common Femoral Vein: No evidence of thrombus. Normal compressibility, respiratory phasicity and response to augmentation. Saphenofemoral Junction: No evidence of thrombus. Normal compressibility and flow on color Doppler imaging. Profunda Femoral Vein: No evidence of thrombus. Normal compressibility and flow on color Doppler imaging. Femoral Vein: No evidence of thrombus. Normal compressibility, respiratory phasicity and response to augmentation. Popliteal Vein: No evidence of thrombus. Normal compressibility, respiratory phasicity and response to augmentation. Calf Veins: No evidence of thrombus. Normal compressibility and flow on color Doppler imaging. Superficial Great Saphenous Vein: No evidence of thrombus. Normal compressibility. Venous Reflux:  None. Other Findings:  None. IMPRESSION: No evidence of deep venous thrombosis in the RIGHT lower extremity. Electronically Signed   By: Ulyses Southward M.D.   On: 02/01/2018 13:58   US Liver Doppler  Result Date: 02/02/2018 CLINICAL DATA:  82 year old female with a history of hepatitis EXAM: DUPLEX ULTRASOUND OF LIVER TECHNIQUE: Color and duplex Doppler ultrasound was performed to evaluate the hepatic in-flow and out-flow vessels.  COMPARISON:  None. FINDINGS: Portal Vein Velocities Main:  32 cm/sec Right:  14 cm/sec Left:  11 cm/sec Hepatic Vein Velocities Right:  26 cm/sec Middle:  34 cm/sec Left:  37 cm/sec Hepatic Artery Velocity:  250 cm/sec Splenic Vein Velocity:  12 cm/sec Varices: Absent Ascites: Absent Mildly heterogeneous echotexture of liver parenchyma. IMPRESSION: Unremarkable duplex of the hepatic vasculature. Heterogeneous echotexture of liver parenchyma, nonspecific, and potentially representing fatty liver or other medical liver disease. Electronically Signed   By: Gilmer Mor D.O.   On: 02/02/2018 08:46    ASSESSMENT AND PLAN:  *Acute febrile illness Secondary to unknown etiology Chest x-ray noted for bronchitis changes-patient without symptomatology, UA unimpressive-admitted for possible UTI/Rocephin discontinued Check blood cultures, check procalcitonin, check for tickborne illness-[place] spotted fever/Lyme disease, vitals per routine, continue close medical monitoring  *Acute on chronic memory deficits Daughter believes that the patient has dementia, patient had mini Folstein done on last week which was negative for dementia Stable/at baseline Head CT unimpressive Neurology consult if expert opinion, check ammonia level, RPR, proceed with MRI of the brain for further evaluation  *Acute transaminitis Most likely secondary to fatty liver-liver ultrasound noted  *Acute kidney injury with chronic kidney disease Most likely secondary to acute dehydration CKD most likely secondary to long-standing hypertension Avoid nephrotoxic agents, renal ultrasound noted for chronic medical renal disease  *Acute on chronic pancytopenia Secondary to unknown etiology Consult oncology for expert opinion, CBC in the morning  *Acute hypokalemia Replete with p.o. potassium, magnesium levels normal  *Acute fatigue/generalized weakness Most likely secondary to dehydration IV fluids for rehydration, physical  therapy to evaluate/treat   All the records are reviewed and case discussed with Care Management/Social Workerr. Management plans discussed with the patient, family and they are in agreement.  CODE STATUS: full  TOTAL TIME TAKING CARE OF THIS PATIENT: 35 minutes.     POSSIBLE D/C IN 1-3 DAYS, DEPENDING ON CLINICAL CONDITION.   Evelena Asa Cieanna Stormes M.D on 02/02/2018   Between 7am to 6pm - Pager - [phone]  After 6pm go to [email or web address] - password [email or web address]  [employer]  Office  [phone]  CC: Primary care physician; [facility]  Note: This dictation was prepared with Dragon dictation along with smaller phrase technology. Any transcriptional errors that result from this process are unintentional.

## 2018-02-02 NOTE — Consult Note (Signed)
NeurologyTEAM PROGRESS NOTE   SUBJECTIVE (INTERVAL HISTORY) Her family is at bedside and she is much improved with fluids and antibiotics. Family says her memory is impaired but this is not acute, noticing it over the last year slowly progressive   OBJECTIVE Vitals:   02/02/18 0437 02/02/18 0500 02/02/18 0614 02/02/18 0934  BP: 130/62     Pulse: 94     Resp: (!) 22     Temp: (!) 100.8 F (38.2 C)  (!) 101.1 F (38.4 C) 98.5 F (36.9 C)  TempSrc: Oral  Oral Oral  SpO2: 94%     Weight:  185 lb (83.9 kg)    Height:        CBC:  Recent Labs  Lab 02/01/18 1144 02/02/18 0452  WBC 3.3* 3.5*  HGB 12.2 10.8*  HCT 37.1 31.6*  MCV 91.7 89.8  PLT 98* 72*    Basic Metabolic Panel:  Recent Labs  Lab 02/01/18 1144 02/02/18 0452 02/02/18 0847  NA 135 137  --   K 3.5 3.4*  --   CL 103 108  --   CO2 22 23  --   GLUCOSE 157* 94  --   BUN 30* 29*  --   CREATININE 2.53* 1.82*  --   CALCIUM 8.6* 7.7*  --   MG  --   --  2.1    Lipid Panel: No results found for: CHOL, TRIG, HDL, CHOLHDL, VLDL, LDLCALC HgbA1c: No results found for: HGBA1C Urine Drug Screen: No results found for: LABOPIA, COCAINSCRNUR, LABBENZ, AMPHETMU, THCU, LABBARB  Alcohol Level No results found for: ETH  IMAGING  Dg Chest 2 View  Result Date: 02/01/2018 CLINICAL DATA:  Increasing weakness for 3 days, unable to stand, history CHF, hypertension EXAM: CHEST - 2 VIEW COMPARISON:  None FINDINGS: Normal heart size, mediastinal contours, and pulmonary vascularity. Atherosclerotic calcification aorta. Mild central peribronchial thickening. Lungs otherwise clear. No infiltrate, pleural effusion or pneumothorax. Multilevel endplate spur formation thoracic spine. IMPRESSION: Mild bronchitic changes without acute infiltrate. Electronically Signed   By: Ulyses Southward M.D.   On: 02/01/2018 13:38   Ct Head Wo Contrast  Result Date: 02/01/2018 CLINICAL DATA:  82 year old female with head and neck injury following fall  several nights ago. Initial encounter. EXAM: CT HEAD WITHOUT CONTRAST CT CERVICAL SPINE WITHOUT CONTRAST TECHNIQUE: Multidetector CT imaging of the head and cervical spine was performed following the standard protocol without intravenous contrast. Multiplanar CT image reconstructions of the cervical spine were also generated. COMPARISON:  None. FINDINGS: CT HEAD FINDINGS Brain: No evidence of acute infarction, hemorrhage, hydrocephalus, extra-axial collection or mass lesion/mass effect. Minimal chronic small-vessel white matter ischemic changes noted. Vascular: Atherosclerotic calcifications identified. Skull: No acute abnormality Sinuses/Orbits: No acute abnormality Other: None CT CERVICAL SPINE FINDINGS Alignment: Normal. Skull base and vertebrae: No acute fracture. No primary bone lesion or focal pathologic process. Soft tissues and spinal canal: No prevertebral fluid or swelling. No visible canal hematoma. Disc levels: Moderate multilevel degenerative disc disease and spondylosis throughout the cervical spine noted contributing to central spinal and bony foraminal narrowing at multiple levels. Upper chest: No acute abnormality Other: None IMPRESSION: 1. No evidence of acute intracranial abnormality. Minimal chronic small-vessel white matter ischemic changes. 2. No static evidence of acute injury to the cervical spine. Moderate multilevel degenerative disc disease/spondylosis. Electronically Signed   By: Harmon Pier M.D.   On: 02/01/2018 17:33   Ct Cervical Spine Wo Contrast  Result Date: 02/01/2018 CLINICAL DATA:  82 year old female with  head and neck injury following fall several nights ago. Initial encounter. EXAM: CT HEAD WITHOUT CONTRAST CT CERVICAL SPINE WITHOUT CONTRAST TECHNIQUE: Multidetector CT imaging of the head and cervical spine was performed following the standard protocol without intravenous contrast. Multiplanar CT image reconstructions of the cervical spine were also generated. COMPARISON:   None. FINDINGS: CT HEAD FINDINGS Brain: No evidence of acute infarction, hemorrhage, hydrocephalus, extra-axial collection or mass lesion/mass effect. Minimal chronic small-vessel white matter ischemic changes noted. Vascular: Atherosclerotic calcifications identified. Skull: No acute abnormality Sinuses/Orbits: No acute abnormality Other: None CT CERVICAL SPINE FINDINGS Alignment: Normal. Skull base and vertebrae: No acute fracture. No primary bone lesion or focal pathologic process. Soft tissues and spinal canal: No prevertebral fluid or swelling. No visible canal hematoma. Disc levels: Moderate multilevel degenerative disc disease and spondylosis throughout the cervical spine noted contributing to central spinal and bony foraminal narrowing at multiple levels. Upper chest: No acute abnormality Other: None IMPRESSION: 1. No evidence of acute intracranial abnormality. Minimal chronic small-vessel white matter ischemic changes. 2. No static evidence of acute injury to the cervical spine. Moderate multilevel degenerative disc disease/spondylosis. Electronically Signed   By: Harmon Pier M.D.   On: 02/01/2018 17:33   US Renal  Result Date: 02/02/2018 CLINICAL DATA:  Acute kidney injury, history cardiomyopathy, CHF, diabetes mellitus, hypertension EXAM: RENAL / URINARY TRACT ULTRASOUND COMPLETE COMPARISON:  None FINDINGS: Right Kidney: Length: 11.0 cm. Normal cortical thickness. Increased cortical echogenicity. Cysts identified, largest laterally 3.8 x 3.6 x 4.7 cm, simple in character. No hydronephrosis or solid mass. No definite shadowing calcification. Left Kidney: Length: 11.6 cm. Normal cortical thickness. Increased cortical echogenicity. Small cysts identified, largest at inferior pole 3.2 x 2.8 x 3.2 cm, simple in character. No solid mass, hydronephrosis or shadowing calcification. Bladder: Appears normal for degree of bladder distention. IMPRESSION: Medical renal disease changes of both kidneys. BILATERAL  renal cysts. Electronically Signed   By: Ulyses Southward M.D.   On: 02/02/2018 08:37   US Venous Img Lower Unilateral Right  Result Date: 02/01/2018 CLINICAL DATA:  RIGHT leg pain for 2 months EXAM: RIGHT LOWER EXTREMITY VENOUS DOPPLER ULTRASOUND TECHNIQUE: Gray-scale sonography with graded compression, as well as color Doppler and duplex ultrasound were performed to evaluate the lower extremity deep venous systems from the level of the common femoral vein and including the common femoral, femoral, profunda femoral, popliteal and calf veins including the posterior tibial, peroneal and gastrocnemius veins when visible. The superficial great saphenous vein was also interrogated. Spectral Doppler was utilized to evaluate flow at rest and with distal augmentation maneuvers in the common femoral, femoral and popliteal veins. COMPARISON:  None FINDINGS: Contralateral Common Femoral Vein: Respiratory phasicity is normal and symmetric with the symptomatic side. No evidence of thrombus. Normal compressibility. Common Femoral Vein: No evidence of thrombus. Normal compressibility, respiratory phasicity and response to augmentation. Saphenofemoral Junction: No evidence of thrombus. Normal compressibility and flow on color Doppler imaging. Profunda Femoral Vein: No evidence of thrombus. Normal compressibility and flow on color Doppler imaging. Femoral Vein: No evidence of thrombus. Normal compressibility, respiratory phasicity and response to augmentation. Popliteal Vein: No evidence of thrombus. Normal compressibility, respiratory phasicity and response to augmentation. Calf Veins: No evidence of thrombus. Normal compressibility and flow on color Doppler imaging. Superficial Great Saphenous Vein: No evidence of thrombus. Normal compressibility. Venous Reflux:  None. Other Findings:  None. IMPRESSION: No evidence of deep venous thrombosis in the RIGHT lower extremity. Electronically Signed   By: Angelyn Punt.D.  On: 02/01/2018  13:58   US Liver Doppler  Result Date: 02/02/2018 CLINICAL DATA:  82 year old female with a history of hepatitis EXAM: DUPLEX ULTRASOUND OF LIVER TECHNIQUE: Color and duplex Doppler ultrasound was performed to evaluate the hepatic in-flow and out-flow vessels. COMPARISON:  None. FINDINGS: Portal Vein Velocities Main:  32 cm/sec Right:  14 cm/sec Left:  11 cm/sec Hepatic Vein Velocities Right:  26 cm/sec Middle:  34 cm/sec Left:  37 cm/sec Hepatic Artery Velocity:  250 cm/sec Splenic Vein Velocity:  12 cm/sec Varices: Absent Ascites: Absent Mildly heterogeneous echotexture of liver parenchyma. IMPRESSION: Unremarkable duplex of the hepatic vasculature. Heterogeneous echotexture of liver parenchyma, nonspecific, and potentially representing fatty liver or other medical liver disease. Electronically Signed   By: Gilmer Mor D.O.   On: 02/02/2018 08:46       PHYSICAL EXAM Physical exam: Exam: Gen: NAD, conversant, well nourised, obese, well groomed                     CV: RRR, no MRG. No Carotid Bruits. No peripheral edema, warm, nontender Eyes: Conjunctivae clear without exudates or hemorrhage  Neuro: Detailed Neurologic Exam  Speech:    Speech is normal; fluent and spontaneous with normal comprehension.  Cognition:    The patient is oriented to person, place, and time;     recent and remote memory intact;     language fluent;     normal attention, concentration,    fund of knowledge Cranial Nerves:    The pupils are equal, round, and reactive to light. The fundi are normal and spontaneous venous pulsations are present. Visual fields are full to finger confrontation. Extraocular movements are intact. Trigeminal sensation is intact and the muscles of mastication are normal. The face is symmetric. The palate elevates in the midline. Hearing intact. Voice is normal. Shoulder shrug is normal. The tongue has normal motion without fasciculations.   Coordination:    No dysmetria  Motor  Observation:    No asymmetry, no atrophy, and no involuntary movements noted. Tone:    Normal muscle tone.    Posture:    Posture is normal. normal erect    Strength:    Strength is V/V in the upper and lower limbs.      Sensation: intact to LT     Reflex Exam:  DTR's:    Deep tendon reflexes in the upper and lower extremities are symmetrical bilaterally.   Toes:    The toes are equiv bilaterally.   Clonus:    Clonus is absent.    ASSESSMENT/PLAN Melissa Vazquez is a 82 y.o. female with history of CHF, diabetes, HTN, obesity presenting with weakness, drowsiness and confusion in the setting of increased BP medications. Treated with IV fluids and antibiotics for possible UTI. Symptoms likely due to dehydration, AKI and urinary tract infection. Patient improved with treatment. Neuro exam non focal.  Familty also reports memory problems will check some labs(B12,tsh,B1,homocysteine,rpr) and she can follow up outpatient neurology as necessary for memory evaluation  Personally examined patient and images, and have participated in and made any corrections needed to history, physical, neuro exam,assessment and plan as stated above.  I have personally obtained the history, evaluated lab date, reviewed imaging studies and agree with radiology interpretations.    Naomie Dean, MD Neurology   Hospital day # 0   To contact Stroke Continuity provider, please refer to [email or web address]. After hours, contact General Neurology

## 2018-02-03 ENCOUNTER — Inpatient Hospital Stay: Payer: Medicare Other

## 2018-02-03 DIAGNOSIS — E119 Type 2 diabetes mellitus without complications: Secondary | ICD-10-CM

## 2018-02-03 DIAGNOSIS — E860 Dehydration: Secondary | ICD-10-CM

## 2018-02-03 DIAGNOSIS — R509 Fever, unspecified: Secondary | ICD-10-CM

## 2018-02-03 DIAGNOSIS — D696 Thrombocytopenia, unspecified: Secondary | ICD-10-CM

## 2018-02-03 DIAGNOSIS — R4182 Altered mental status, unspecified: Secondary | ICD-10-CM

## 2018-02-03 DIAGNOSIS — D649 Anemia, unspecified: Secondary | ICD-10-CM

## 2018-02-03 LAB — CBC WITH DIFFERENTIAL/PLATELET
BASOS ABS: 0.1 10*3/uL (ref 0–0.1)
Basophils Relative: 1 %
EOS ABS: 0 10*3/uL (ref 0–0.7)
Eosinophils Relative: 0 %
HCT: 31 % — ABNORMAL LOW (ref 35.0–47.0)
Hemoglobin: 10.8 g/dL — ABNORMAL LOW (ref 12.0–16.0)
LYMPHS PCT: 58 %
Lymphs Abs: 3.3 10*3/uL (ref 1.0–3.6)
MCH: 31.3 pg (ref 26.0–34.0)
MCHC: 34.7 g/dL (ref 32.0–36.0)
MCV: 90.1 fL (ref 80.0–100.0)
MONOS PCT: 15 %
Monocytes Absolute: 0.9 10*3/uL (ref 0.2–0.9)
NEUTROS ABS: 1.5 10*3/uL (ref 1.4–6.5)
Neutrophils Relative %: 26 %
PLATELETS: 69 10*3/uL — AB (ref 150–440)
RBC: 3.44 MIL/uL — ABNORMAL LOW (ref 3.80–5.20)
RDW: 15.3 % — AB (ref 11.5–14.5)
WBC: 5.8 10*3/uL (ref 3.6–11.0)

## 2018-02-03 LAB — HOMOCYSTEINE: HOMOCYSTEINE-NORM: 17.6 umol/L — AB (ref 0.0–15.0)

## 2018-02-03 LAB — DAT, POLYSPECIFIC AHG (ARMC ONLY): Polyspecific AHG test: NEGATIVE

## 2018-02-03 LAB — COMPREHENSIVE METABOLIC PANEL
ALBUMIN: 3 g/dL — AB (ref 3.5–5.0)
ALT: 112 U/L — ABNORMAL HIGH (ref 14–54)
ANION GAP: 6 (ref 5–15)
AST: 136 U/L — ABNORMAL HIGH (ref 15–41)
Alkaline Phosphatase: 76 U/L (ref 38–126)
BILIRUBIN TOTAL: 0.5 mg/dL (ref 0.3–1.2)
BUN: 19 mg/dL (ref 6–20)
CO2: 21 mmol/L — ABNORMAL LOW (ref 22–32)
Calcium: 7.9 mg/dL — ABNORMAL LOW (ref 8.9–10.3)
Chloride: 109 mmol/L (ref 101–111)
Creatinine, Ser: 1.42 mg/dL — ABNORMAL HIGH (ref 0.44–1.00)
GFR, EST AFRICAN AMERICAN: 40 mL/min — AB (ref >60)
GFR, EST NON AFRICAN AMERICAN: 34 mL/min — AB (ref >60)
GLUCOSE: 93 mg/dL (ref 65–99)
POTASSIUM: 3.8 mmol/L (ref 3.5–5.1)
Sodium: 136 mmol/L (ref 135–145)
TOTAL PROTEIN: 6 g/dL — AB (ref 6.5–8.1)

## 2018-02-03 LAB — RETICULOCYTES
RBC.: 3.6 MIL/uL — AB (ref 3.80–5.20)
Retic Count, Absolute: 18 10*3/uL — ABNORMAL LOW (ref 19.0–183.0)
Retic Ct Pct: 0.5 % (ref 0.4–3.1)

## 2018-02-03 LAB — URINE CULTURE: Culture: NO GROWTH

## 2018-02-03 LAB — URINALYSIS, COMPLETE (UACMP) WITH MICROSCOPIC
Bacteria, UA: NONE SEEN
Bilirubin Urine: NEGATIVE
GLUCOSE, UA: NEGATIVE mg/dL
HGB URINE DIPSTICK: NEGATIVE
KETONES UR: NEGATIVE mg/dL
Leukocytes, UA: NEGATIVE
NITRITE: NEGATIVE
Protein, ur: NEGATIVE mg/dL
Specific Gravity, Urine: 1.008 (ref 1.005–1.030)
pH: 5 (ref 5.0–8.0)

## 2018-02-03 LAB — HEPATITIS PANEL, ACUTE
HCV Ab: <0.1 s/co ratio (ref 0.0–0.9)
HEP B C IGM: NEGATIVE
HEP B S AG: NEGATIVE
Hep A IgM: NEGATIVE

## 2018-02-03 LAB — FERRITIN: Ferritin: 517 ng/mL — ABNORMAL HIGH (ref 11–307)

## 2018-02-03 LAB — PROTIME-INR
INR: 0.98
Prothrombin Time: 12.9 seconds (ref 11.4–15.2)

## 2018-02-03 LAB — APTT: aPTT: 69 seconds — ABNORMAL HIGH (ref 24–36)

## 2018-02-03 LAB — IRON AND TIBC
IRON: 30 ug/dL (ref 28–170)
Saturation Ratios: 10 % — ABNORMAL LOW (ref 10.4–31.8)
TIBC: 288 ug/dL (ref 250–450)
UIBC: 258 ug/dL

## 2018-02-03 LAB — LACTATE DEHYDROGENASE: LDH: 327 U/L — ABNORMAL HIGH (ref 98–192)

## 2018-02-03 LAB — PATHOLOGIST SMEAR REVIEW

## 2018-02-03 NOTE — Evaluation (Signed)
Physical Therapy Evaluation Patient Details Name: Melissa Vazquez MRN: [id number] DOB: May 27, 1936 Today's Date: 02/03/2018   History of Present Illness  82 yo female with onset of AKI and weakness with new memory changes, transaminitis, bronchitis and low K+ was admitted. Her presentation is concerning as she is home alone and has a split level living situation.  PMHx:  fatty liver, CHF, HTN,  pancytopenia, acute and chronic    Clinical Impression  Pt is up to walk with HHA and noted her instability is minor but there.  Her LE coordination is minimally impaired as well as being somewhat confused and slow to answer questions such as the layout of her home.  Niece is there and answering questions that pt cannot.  Will use SPC on the stairs as needed next visit and see how her control is going, to ensure that she can safely transition home.    Follow Up Recommendations Home health PT;Supervision for mobility/OOB    Equipment Recommendations  Cane(PT will use next visit)    Recommendations for Other Services       Precautions / Restrictions Precautions Precautions: Fall(confusion) Restrictions Weight Bearing Restrictions: No      Mobility  Bed Mobility Overal bed mobility: Needs Assistance Bed Mobility: Supine to Sit     Supine to sit: Min guard     General bed mobility comments: pt using bedrail and elevated HOB  Transfers Overall transfer level: Modified independent Equipment used: 1 person hand held assist             General transfer comment: touch to L hand and pt can stand  Ambulation/Gait Ambulation/Gait assistance: Min guard Ambulation Distance (Feet): 20 Feet Assistive device: 1 person hand held assist Gait Pattern/deviations: Step-through pattern;Decreased stride length;Narrow base of support Gait velocity: reduced Gait velocity interpretation: <1.31 ft/sec, indicative of household ambulator General Gait Details: pt goes slowly with touch guidance on her  hand and repeated some cues for her mobility, including controlling descent to sit  Stairs            Wheelchair Mobility    Modified Rankin (Stroke Patients Only)       Balance Overall balance assessment: Needs assistance Sitting-balance support: Feet supported Sitting balance-Leahy Scale: Good     Standing balance support: Single extremity supported Standing balance-Leahy Scale: Fair                               Pertinent Vitals/Pain Pain Assessment: No/denies pain    Home Living Family/patient expects to be discharged to:: Private residence Living Arrangements: Alone Available Help at Discharge: Family;Available PRN/intermittently Type of Home: House Home Access: Stairs to enter Entrance Stairs-Rails: None Entrance Stairs-Number of Steps: 3 Home Layout: Multi-level(has split level design) Home Equipment: None Additional Comments: has been independently living and no AD    Prior Function Level of Independence: Independent               Hand Dominance   Dominant Hand: Right    Extremity/Trunk Assessment   Upper Extremity Assessment Upper Extremity Assessment: Overall WFL for tasks assessed    Lower Extremity Assessment Lower Extremity Assessment: Generalized weakness    Cervical / Trunk Assessment Cervical / Trunk Assessment: Normal  Communication   Communication: No difficulties  Cognition Arousal/Alertness: Awake/alert Behavior During Therapy: WFL for tasks assessed/performed Overall Cognitive Status: Impaired/Different from baseline Area of Impairment: Safety/judgement;Awareness;Problem solving  Safety/Judgement: Decreased awareness of safety;Decreased awareness of deficits Awareness: Intellectual Problem Solving: Slow processing        General Comments General comments (skin integrity, edema, etc.): pt has steps with no rails and should be given a SPC to attempt these for home, so  ensure she is not a fall risk    Exercises     Assessment/Plan    PT Assessment Patient needs continued PT services  PT Problem List Decreased strength;Decreased range of motion;Decreased activity tolerance;Decreased balance;Decreased mobility;Decreased coordination;Decreased cognition;Decreased knowledge of use of DME;Decreased safety awareness       PT Treatment Interventions DME instruction;Gait training;Stair training;Functional mobility training;Therapeutic activities;Therapeutic exercise;Balance training;Neuromuscular re-education;Patient/family education    PT Goals (Current goals can be found in the Care Plan section)  Acute Rehab PT Goals Patient Stated Goal: to get her breakfast PT Goal Formulation: With patient/family Time For Goal Achievement: 02/10/18 Potential to Achieve Goals: Good    Frequency Min 2X/week   Barriers to discharge Inaccessible home environment;Decreased caregiver support home alone with stairs and no rails    Co-evaluation               AM-PAC PT "6 Clicks" Daily Activity  Outcome Measure Difficulty turning over in bed (including adjusting bedclothes, sheets and blankets)?: A Little Difficulty moving from lying on back to sitting on the side of the bed? : A Little Difficulty sitting down on and standing up from a chair with arms (e.g., wheelchair, bedside commode, etc,.)?: Unable Help needed moving to and from a bed to chair (including a wheelchair)?: A Little Help needed walking in hospital room?: A Little Help needed climbing 3-5 steps with a railing? : A Little 6 Click Score: 16    End of Session Equipment Utilized During Treatment: Gait belt Activity Tolerance: Patient tolerated treatment well;Other (comment)(cognition is more a limit) Patient left: in chair;with call bell/phone within reach;with chair alarm set;with family/visitor present Nurse Communication: Mobility status PT Visit Diagnosis: Unsteadiness on feet (R26.81);Muscle  weakness (generalized) (M62.81);Difficulty in walking, not elsewhere classified (R26.2)    Time: 1610-9604 PT Time Calculation (min) (ACUTE ONLY): 13 min   Charges:   PT Evaluation $PT Eval Moderate Complexity: 1 Mod     PT G Codes:   PT G-Codes **NOT FOR INPATIENT CLASS** Functional Assessment Tool Used: AM-PAC 6 Clicks Basic Mobility    Ivar Drape 02/03/2018, 10:50 AM   Samul Dada, PT MS Acute Rehab Dept. Number: [facility] [id number] and [facility] [phone]

## 2018-02-03 NOTE — Consult Note (Signed)
[facility] NOTE  Patient Care Team: [facility] as PCP - General  CHIEF COMPLAINTS/PURPOSE OF CONSULTATION: Anemia/thrombocytopenia   HISTORY OF PRESENTING ILLNESS:  Melissa Vazquez 82 y.o.  female prior history of CHF/diabetes-is currently admitted the hospital for fevers/dehydration mental status changes.  Patient incidentally noted to have anemia/thrombocytopenia platelets 60s to 90s.  Patient states to have fever started few days ago.  Patient had poor p.o. intake.  No nausea vomiting or diarrhea.  Denies any shortness of breath or cough.  In the hospital patient noted to have hemoglobin around 10; platelets around 60s to 90s.  Creatinine on 1.4; which improved since presentation.  MRI brain-negative for any acute process.  This morning patient feels significantly improved; mental status changes resolved.  ROS: A complete 10 point review of system is done which is negative except mentioned above in history of present illness  MEDICAL HISTORY:  Past Medical History:  Diagnosis Date  . Cardiomyopathy ([place])   . CHF (congestive heart failure) ([place])   . Diabetes mellitus without complication (HCC)    prediabetic  . Hypertension   . Varicose vein of leg     SURGICAL HISTORY: Past Surgical History:  Procedure Laterality Date  . NECK SURGERY      SOCIAL HISTORY: Retired Education officer, museum; no smoking; ocassional alcohol. Self ; [place]; goes to [place] [facility] Social History   Socioeconomic History  . Marital status: Single    Spouse name: Not on file  . Number of children: Not on file  . Years of education: Not on file  . Highest education level: Not on file  Occupational History  . Not on file  Social Needs  . Financial resource strain: Not on file  . Food insecurity:    Worry: Not on file    Inability: Not on file  . Transportation needs:    Medical: Not on file    Non-medical: Not on file  Tobacco Use   . Smoking status: Never Smoker  . Smokeless tobacco: Never Used  Substance and Sexual Activity  . Alcohol use: Never    Frequency: Never  . Drug use: Never  . Sexual activity: Not on file  Lifestyle  . Physical activity:    Days per week: Not on file    Minutes per session: Not on file  . Stress: Not on file  Relationships  . Social connections:    Talks on phone: Not on file    Gets together: Not on file    Attends religious service: Not on file    Active member of club or organization: Not on file    Attends meetings of clubs or organizations: Not on file    Relationship status: Not on file  . Intimate partner violence:    Fear of current or ex partner: Not on file    Emotionally abused: Not on file    Physically abused: Not on file    Forced sexual activity: Not on file  Other Topics Concern  . Not on file  Social History Narrative  . Not on file    FAMILY HISTORY: HNT/ DM in blood problem History reviewed. No pertinent family history.  ALLERGIES:  has No Known Allergies.  MEDICATIONS:  Current Facility-Administered Medications  Medication Dose Route Frequency Provider Last Rate Last Dose  . acetaminophen (TYLENOL) tablet 650 mg  650 mg Oral Q6H PRN Idelle Crouch, MD   650 mg at 02/02/18 [phone]  Or  . acetaminophen (TYLENOL) suppository 650 mg  650 mg Rectal Q6H PRN Idelle Crouch, MD      . amLODipine (NORVASC) tablet 5 mg  5 mg Oral Daily Idelle Crouch, MD   5 mg at 02/03/18 8466  . aspirin EC tablet 81 mg  81 mg Oral Daily Idelle Crouch, MD   81 mg at 02/03/18 5993  . bisacodyl (DULCOLAX) suppository 10 mg  10 mg Rectal Daily PRN Idelle Crouch, MD      . docusate sodium (COLACE) capsule 100 mg  100 mg Oral BID Idelle Crouch, MD   100 mg at 02/03/18 0937  . heparin injection 5,000 Units  5,000 Units Subcutaneous Q8H Idelle Crouch, MD   5,000 Units at 02/03/18 0603  . metoprolol tartrate (LOPRESSOR) tablet 25 mg  25 mg Oral BID Idelle Crouch, MD   25 mg at 02/03/18 5701  . ondansetron (ZOFRAN) tablet 4 mg  4 mg Oral Q6H PRN Idelle Crouch, MD       Or  . ondansetron (ZOFRAN) injection 4 mg  4 mg Intravenous Q6H PRN Idelle Crouch, MD      . pantoprazole (PROTONIX) EC tablet 40 mg  40 mg Oral Daily Idelle Crouch, MD   40 mg at 02/03/18 7793      .  PHYSICAL EXAMINATION:  Vitals:   02/03/18 0430 02/03/18 0937  BP: (!) 136/57 (!) 141/61  Pulse: 87 84  Resp: 14   Temp: 99.8 F (37.7 C)   SpO2: 97%    Filed Weights   02/01/18 1135 02/02/18 0500 02/03/18 0430  Weight: 185 lb (83.9 kg) 185 lb (83.9 kg) 193 lb 12.8 oz (87.9 kg)    GENERAL: Well-nourished well-developed; Alert, no distress and comfortable.   With her niece.  EYES: no pallor or icterus OROPHARYNX: no thrush or ulceration. NECK: supple, no masses felt LYMPH:  no palpable lymphadenopathy in the cervical, axillary or inguinal regions LUNGS: decreased breath sounds to auscultation at bases and  No wheeze or crackles HEART/CVS: regular rate & rhythm and no murmurs; No lower extremity edema ABDOMEN: abdomen soft, non-tender and normal bowel sounds Musculoskeletal:no cyanosis of digits and no clubbing  PSYCH: alert & oriented x 3 with fluent speech NEURO: no focal motor/sensory deficits SKIN:  no rashes or significant lesions  LABORATORY DATA:  I have reviewed the data as listed Lab Results  Component Value Date   WBC 5.8 02/03/2018   HGB 10.8 (L) 02/03/2018   HCT 31.0 (L) 02/03/2018   MCV 90.1 02/03/2018   PLT 69 (L) 02/03/2018   Recent Labs    02/01/18 1144 02/02/18 0452 02/03/18 0403  NA 135 137 136  K 3.5 3.4* 3.8  CL 103 108 109  CO2 22 23 21*  GLUCOSE 157* 94 93  BUN 30* 29* 19  CREATININE 2.53* 1.82* 1.42*  CALCIUM 8.6* 7.7* 7.9*  GFRNONAA 17* 25* 34*  GFRAA 20* 29* 40*  PROT  --  5.8* 6.0*  ALBUMIN  --  2.9* 3.0*  AST  --  125* 136*  ALT  --  104* 112*  ALKPHOS  --  69 76  BILITOT  --  0.6 0.5     RADIOGRAPHIC STUDIES: I have personally reviewed the radiological images as listed and agreed with the findings in the report. Dg Chest 2 View  Result Date: 02/01/2018 CLINICAL DATA:  Increasing weakness for 3 days, unable to stand, history CHF,  hypertension EXAM: CHEST - 2 VIEW COMPARISON:  None FINDINGS: Normal heart size, mediastinal contours, and pulmonary vascularity. Atherosclerotic calcification aorta. Mild central peribronchial thickening. Lungs otherwise clear. No infiltrate, pleural effusion or pneumothorax. Multilevel endplate spur formation thoracic spine. IMPRESSION: Mild bronchitic changes without acute infiltrate. Electronically Signed   By: Lavonia Dana M.D.   On: 02/01/2018 13:38   Ct Head Wo Contrast  Result Date: 02/01/2018 CLINICAL DATA:  82 year old female with head and neck injury following fall several nights ago. Initial encounter. EXAM: CT HEAD WITHOUT CONTRAST CT CERVICAL SPINE WITHOUT CONTRAST TECHNIQUE: Multidetector CT imaging of the head and cervical spine was performed following the standard protocol without intravenous contrast. Multiplanar CT image reconstructions of the cervical spine were also generated. COMPARISON:  None. FINDINGS: CT HEAD FINDINGS Brain: No evidence of acute infarction, hemorrhage, hydrocephalus, extra-axial collection or mass lesion/mass effect. Minimal chronic small-vessel white matter ischemic changes noted. Vascular: Atherosclerotic calcifications identified. Skull: No acute abnormality Sinuses/Orbits: No acute abnormality Other: None CT CERVICAL SPINE FINDINGS Alignment: Normal. Skull base and vertebrae: No acute fracture. No primary bone lesion or focal pathologic process. Soft tissues and spinal canal: No prevertebral fluid or swelling. No visible canal hematoma. Disc levels: Moderate multilevel degenerative disc disease and spondylosis throughout the cervical spine noted contributing to central spinal and bony foraminal narrowing at multiple  levels. Upper chest: No acute abnormality Other: None IMPRESSION: 1. No evidence of acute intracranial abnormality. Minimal chronic small-vessel white matter ischemic changes. 2. No static evidence of acute injury to the cervical spine. Moderate multilevel degenerative disc disease/spondylosis. Electronically Signed   By: Margarette Canada M.D.   On: 02/01/2018 17:33   Ct Cervical Spine Wo Contrast  Result Date: 02/01/2018 CLINICAL DATA:  82 year old female with head and neck injury following fall several nights ago. Initial encounter. EXAM: CT HEAD WITHOUT CONTRAST CT CERVICAL SPINE WITHOUT CONTRAST TECHNIQUE: Multidetector CT imaging of the head and cervical spine was performed following the standard protocol without intravenous contrast. Multiplanar CT image reconstructions of the cervical spine were also generated. COMPARISON:  None. FINDINGS: CT HEAD FINDINGS Brain: No evidence of acute infarction, hemorrhage, hydrocephalus, extra-axial collection or mass lesion/mass effect. Minimal chronic small-vessel white matter ischemic changes noted. Vascular: Atherosclerotic calcifications identified. Skull: No acute abnormality Sinuses/Orbits: No acute abnormality Other: None CT CERVICAL SPINE FINDINGS Alignment: Normal. Skull base and vertebrae: No acute fracture. No primary bone lesion or focal pathologic process. Soft tissues and spinal canal: No prevertebral fluid or swelling. No visible canal hematoma. Disc levels: Moderate multilevel degenerative disc disease and spondylosis throughout the cervical spine noted contributing to central spinal and bony foraminal narrowing at multiple levels. Upper chest: No acute abnormality Other: None IMPRESSION: 1. No evidence of acute intracranial abnormality. Minimal chronic small-vessel white matter ischemic changes. 2. No static evidence of acute injury to the cervical spine. Moderate multilevel degenerative disc disease/spondylosis. Electronically Signed   By: Margarette Canada M.D.    On: 02/01/2018 17:33   Mr Brain Wo Contrast  Result Date: 02/02/2018 CLINICAL DATA:  Acute presentation with mental status changes and confusion. EXAM: MRI HEAD WITHOUT CONTRAST TECHNIQUE: Multiplanar, multiecho pulse sequences of the brain and surrounding structures were obtained without intravenous contrast. COMPARISON:  CT 02/01/2018 FINDINGS: Brain: Diffusion imaging does not show any acute or subacute infarction. The brainstem and cerebellum are normal. Cerebral hemispheres show moderate chronic small-vessel ischemic changes of the hemispheric white matter. No cortical or large vessel territory infarction. No mass lesion, hemorrhage, hydrocephalus or extra-axial collection.  Vascular: Major vessels at the base of the brain show flow. Skull and upper cervical spine: Negative Sinuses/Orbits: Clear/normal Other: None IMPRESSION: No acute or reversible finding. Moderate chronic small-vessel ischemic changes of the cerebral hemispheric white matter. Electronically Signed   By: Nelson Chimes M.D.   On: 02/02/2018 14:15   US Renal  Result Date: 02/02/2018 CLINICAL DATA:  Acute kidney injury, history cardiomyopathy, CHF, diabetes mellitus, hypertension EXAM: RENAL / URINARY TRACT ULTRASOUND COMPLETE COMPARISON:  None FINDINGS: Right Kidney: Length: 11.0 cm. Normal cortical thickness. Increased cortical echogenicity. Cysts identified, largest laterally 3.8 x 3.6 x 4.7 cm, simple in character. No hydronephrosis or solid mass. No definite shadowing calcification. Left Kidney: Length: 11.6 cm. Normal cortical thickness. Increased cortical echogenicity. Small cysts identified, largest at inferior pole 3.2 x 2.8 x 3.2 cm, simple in character. No solid mass, hydronephrosis or shadowing calcification. Bladder: Appears normal for degree of bladder distention. IMPRESSION: Medical renal disease changes of both kidneys. BILATERAL renal cysts. Electronically Signed   By: Lavonia Dana M.D.   On: 02/02/2018 08:37   US Venous  Img Lower Unilateral Right  Result Date: 02/01/2018 CLINICAL DATA:  RIGHT leg pain for 2 months EXAM: RIGHT LOWER EXTREMITY VENOUS DOPPLER ULTRASOUND TECHNIQUE: Gray-scale sonography with graded compression, as well as color Doppler and duplex ultrasound were performed to evaluate the lower extremity deep venous systems from the level of the common femoral vein and including the common femoral, femoral, profunda femoral, popliteal and calf veins including the posterior tibial, peroneal and gastrocnemius veins when visible. The superficial great saphenous vein was also interrogated. Spectral Doppler was utilized to evaluate flow at rest and with distal augmentation maneuvers in the common femoral, femoral and popliteal veins. COMPARISON:  None FINDINGS: Contralateral Common Femoral Vein: Respiratory phasicity is normal and symmetric with the symptomatic side. No evidence of thrombus. Normal compressibility. Common Femoral Vein: No evidence of thrombus. Normal compressibility, respiratory phasicity and response to augmentation. Saphenofemoral Junction: No evidence of thrombus. Normal compressibility and flow on color Doppler imaging. Profunda Femoral Vein: No evidence of thrombus. Normal compressibility and flow on color Doppler imaging. Femoral Vein: No evidence of thrombus. Normal compressibility, respiratory phasicity and response to augmentation. Popliteal Vein: No evidence of thrombus. Normal compressibility, respiratory phasicity and response to augmentation. Calf Veins: No evidence of thrombus. Normal compressibility and flow on color Doppler imaging. Superficial Great Saphenous Vein: No evidence of thrombus. Normal compressibility. Venous Reflux:  None. Other Findings:  None. IMPRESSION: No evidence of deep venous thrombosis in the RIGHT lower extremity. Electronically Signed   By: Lavonia Dana M.D.   On: 02/01/2018 13:58   US Liver Doppler  Addendum Date: 02/03/2018   ADDENDUM REPORT: 02/03/2018 09:03  ADDENDUM: Normal spleen size: 5.6 x 4.3 x 6.5 cm (volume = 82 cm^3) Electronically Signed   By: Lucrezia Europe M.D.   On: 02/03/2018 09:03   Result Date: 02/03/2018 CLINICAL DATA:  82 year old female with a history of hepatitis EXAM: DUPLEX ULTRASOUND OF LIVER TECHNIQUE: Color and duplex Doppler ultrasound was performed to evaluate the hepatic in-flow and out-flow vessels. COMPARISON:  None. FINDINGS: Portal Vein Velocities Main:  32 cm/sec Right:  14 cm/sec Left:  11 cm/sec Hepatic Vein Velocities Right:  26 cm/sec Middle:  34 cm/sec Left:  37 cm/sec Hepatic Artery Velocity:  250 cm/sec Splenic Vein Velocity:  12 cm/sec Varices: Absent Ascites: Absent Mildly heterogeneous echotexture of liver parenchyma. IMPRESSION: Unremarkable duplex of the hepatic vasculature. Heterogeneous echotexture of liver parenchyma, nonspecific, and  potentially representing fatty liver or other medical liver disease. Electronically Signed: By: Corrie Mckusick D.O. On: 02/02/2018 08:46    ASSESSMENT & PLAN:   # 82 year old female patient currently admitted the hospital for fevers/mental status changes-noted to have thrombocytopenia/mild anemia/and also mild renal insufficiency creatinine 1.42  # Anemia hemoglobin on 10/platelets 70 to 90s-the etiology is unclear.Suspicion for microangiopathic hemolytic anemia-is low.  Check LDH/iron studies ferritin; myeloma panel; copper zinc; hepatitis panel; review of peripheral smear.  Ultrasound-negative for any splenomegaly.  Also discussed the possible need for a bone marrow biopsy-if above work-up is inconclusive.   # Renal insufficiency creatinine 1.4; improved from admission-2.53-after IV hydration.  Ultrasound kidneys no evidence of an obstruction ; positive for medical renal disease.  #Fevers-unclear etiology; awaiting blood cultures; defer to primary team.  # Mental status changes-likely secondary to underlying infection/resolved; back to baseline.   Thank you Dr.Salaray for  allowing me to participate in the care of your pleasant patient. Please do not hesitate to contact me with questions or concerns in the interim. Discussed with Dr.Salary.   # I also spoke to pt's daughter, Adria-/dentist- [phone] the above plan.  All questions were answered. The patient knows to call the clinic with any problems, questions or concerns.  Addendum: Reviewed the peripheral smear negative for schistocytes.  LDH elevated; check haptoglobin; DAT; also complements; UA.  Elevated PTT at 69-order mixing study.    Cammie Sickle, MD 02/03/2018 1:26 PM

## 2018-02-03 NOTE — Plan of Care (Signed)

## 2018-02-03 NOTE — Progress Notes (Signed)
[employer] - [place] [facility]   PATIENT NAME: Melissa Vazquez    MR#:  [id number]  DATE OF BIRTH:  1936-02-05  SUBJECTIVE:  CHIEF COMPLAINT:  No chief complaint on file. Patient without complaint, family at the bedside, oncology input greatly appreciated  REVIEW OF SYSTEMS:  CONSTITUTIONAL: No fever, fatigue or weakness.  EYES: No blurred or double vision.  EARS, NOSE, AND THROAT: No tinnitus or ear pain.  RESPIRATORY: No cough, shortness of breath, wheezing or hemoptysis.  CARDIOVASCULAR: No chest pain, orthopnea, edema.  GASTROINTESTINAL: No nausea, vomiting, diarrhea or abdominal pain.  GENITOURINARY: No dysuria, hematuria.  ENDOCRINE: No polyuria, nocturia,  HEMATOLOGY: No anemia, easy bruising or bleeding SKIN: No rash or lesion. MUSCULOSKELETAL: No joint pain or arthritis.   NEUROLOGIC: No tingling, numbness, weakness.  PSYCHIATRY: No anxiety or depression.   ROS  DRUG ALLERGIES:  No Known Allergies  VITALS:  Blood pressure (!) 141/61, pulse 84, temperature 99.8 F (37.7 C), temperature source Oral, resp. rate 14, height 5' 5.5" (1.664 m), weight 87.9 kg (193 lb 12.8 oz), SpO2 97 %.  PHYSICAL EXAMINATION:  GENERAL:  82 y.o.-year-old patient lying in the bed with no acute distress.  EYES: Pupils equal, round, reactive to light and accommodation. No scleral icterus. Extraocular muscles intact.  HEENT: Head atraumatic, normocephalic. Oropharynx and nasopharynx clear.  NECK:  Supple, no jugular venous distention. No thyroid enlargement, no tenderness.  LUNGS: Normal breath sounds bilaterally, no wheezing, rales,rhonchi or crepitation. No use of accessory muscles of respiration.  CARDIOVASCULAR: S1, S2 normal. No murmurs, rubs, or gallops.  ABDOMEN: Soft, nontender, nondistended. Bowel sounds present. No organomegaly or mass.  EXTREMITIES: No pedal edema, cyanosis, or clubbing.  NEUROLOGIC: Cranial nerves II through XII are intact. Muscle strength 5/5 in  all extremities. Sensation intact. Gait not checked.  PSYCHIATRIC: The patient is alert and oriented x 3.  SKIN: No obvious rash, lesion, or ulcer.   Physical Exam LABORATORY PANEL:   CBC Recent Labs  Lab 02/03/18 0403  WBC 5.8  HGB 10.8*  HCT 31.0*  PLT 69*   ------------------------------------------------------------------------------------------------------------------  Chemistries  Recent Labs  Lab 02/02/18 0847 02/03/18 0403  NA  --  136  K  --  3.8  CL  --  109  CO2  --  21*  GLUCOSE  --  93  BUN  --  19  CREATININE  --  1.42*  CALCIUM  --  7.9*  MG 2.1  --   AST  --  136*  ALT  --  112*  ALKPHOS  --  76  BILITOT  --  0.5   ------------------------------------------------------------------------------------------------------------------  Cardiac Enzymes Recent Labs  Lab 02/01/18 1144  TROPONINI <0.03   ------------------------------------------------------------------------------------------------------------------  RADIOLOGY:  Ct Head Wo Contrast  Result Date: 02/01/2018 CLINICAL DATA:  82 year old female with head and neck injury following fall several nights ago. Initial encounter. EXAM: CT HEAD WITHOUT CONTRAST CT CERVICAL SPINE WITHOUT CONTRAST TECHNIQUE: Multidetector CT imaging of the head and cervical spine was performed following the standard protocol without intravenous contrast. Multiplanar CT image reconstructions of the cervical spine were also generated. COMPARISON:  None. FINDINGS: CT HEAD FINDINGS Brain: No evidence of acute infarction, hemorrhage, hydrocephalus, extra-axial collection or mass lesion/mass effect. Minimal chronic small-vessel white matter ischemic changes noted. Vascular: Atherosclerotic calcifications identified. Skull: No acute abnormality Sinuses/Orbits: No acute abnormality Other: None CT CERVICAL SPINE FINDINGS Alignment: Normal. Skull base and vertebrae: No acute fracture. No primary bone lesion or focal pathologic  process. Soft tissues and spinal canal: No prevertebral fluid or swelling. No visible canal hematoma. Disc levels: Moderate multilevel degenerative disc disease and spondylosis throughout the cervical spine noted contributing to central spinal and bony foraminal narrowing at multiple levels. Upper chest: No acute abnormality Other: None IMPRESSION: 1. No evidence of acute intracranial abnormality. Minimal chronic small-vessel white matter ischemic changes. 2. No static evidence of acute injury to the cervical spine. Moderate multilevel degenerative disc disease/spondylosis. Electronically Signed   By: Harmon Pier M.D.   On: 02/01/2018 17:33   Ct Cervical Spine Wo Contrast  Result Date: 02/01/2018 CLINICAL DATA:  82 year old female with head and neck injury following fall several nights ago. Initial encounter. EXAM: CT HEAD WITHOUT CONTRAST CT CERVICAL SPINE WITHOUT CONTRAST TECHNIQUE: Multidetector CT imaging of the head and cervical spine was performed following the standard protocol without intravenous contrast. Multiplanar CT image reconstructions of the cervical spine were also generated. COMPARISON:  None. FINDINGS: CT HEAD FINDINGS Brain: No evidence of acute infarction, hemorrhage, hydrocephalus, extra-axial collection or mass lesion/mass effect. Minimal chronic small-vessel white matter ischemic changes noted. Vascular: Atherosclerotic calcifications identified. Skull: No acute abnormality Sinuses/Orbits: No acute abnormality Other: None CT CERVICAL SPINE FINDINGS Alignment: Normal. Skull base and vertebrae: No acute fracture. No primary bone lesion or focal pathologic process. Soft tissues and spinal canal: No prevertebral fluid or swelling. No visible canal hematoma. Disc levels: Moderate multilevel degenerative disc disease and spondylosis throughout the cervical spine noted contributing to central spinal and bony foraminal narrowing at multiple levels. Upper chest: No acute abnormality Other: None  IMPRESSION: 1. No evidence of acute intracranial abnormality. Minimal chronic small-vessel white matter ischemic changes. 2. No static evidence of acute injury to the cervical spine. Moderate multilevel degenerative disc disease/spondylosis. Electronically Signed   By: Harmon Pier M.D.   On: 02/01/2018 17:33   Mr Brain Wo Contrast  Result Date: 02/02/2018 CLINICAL DATA:  Acute presentation with mental status changes and confusion. EXAM: MRI HEAD WITHOUT CONTRAST TECHNIQUE: Multiplanar, multiecho pulse sequences of the brain and surrounding structures were obtained without intravenous contrast. COMPARISON:  CT 02/01/2018 FINDINGS: Brain: Diffusion imaging does not show any acute or subacute infarction. The brainstem and cerebellum are normal. Cerebral hemispheres show moderate chronic small-vessel ischemic changes of the hemispheric white matter. No cortical or large vessel territory infarction. No mass lesion, hemorrhage, hydrocephalus or extra-axial collection. Vascular: Major vessels at the base of the brain show flow. Skull and upper cervical spine: Negative Sinuses/Orbits: Clear/normal Other: None IMPRESSION: No acute or reversible finding. Moderate chronic small-vessel ischemic changes of the cerebral hemispheric white matter. Electronically Signed   By: Paulina Fusi M.D.   On: 02/02/2018 14:15   US Renal  Result Date: 02/02/2018 CLINICAL DATA:  Acute kidney injury, history cardiomyopathy, CHF, diabetes mellitus, hypertension EXAM: RENAL / URINARY TRACT ULTRASOUND COMPLETE COMPARISON:  None FINDINGS: Right Kidney: Length: 11.0 cm. Normal cortical thickness. Increased cortical echogenicity. Cysts identified, largest laterally 3.8 x 3.6 x 4.7 cm, simple in character. No hydronephrosis or solid mass. No definite shadowing calcification. Left Kidney: Length: 11.6 cm. Normal cortical thickness. Increased cortical echogenicity. Small cysts identified, largest at inferior pole 3.2 x 2.8 x 3.2 cm, simple in  character. No solid mass, hydronephrosis or shadowing calcification. Bladder: Appears normal for degree of bladder distention. IMPRESSION: Medical renal disease changes of both kidneys. BILATERAL renal cysts. Electronically Signed   By: Ulyses Southward M.D.   On: 02/02/2018 08:37   US Liver Doppler  Addendum Date:  02/03/2018   ADDENDUM REPORT: 02/03/2018 09:03 ADDENDUM: Normal spleen size: 5.6 x 4.3 x 6.5 cm (volume = 82 cm^3) Electronically Signed   By: Corlis Leak M.D.   On: 02/03/2018 09:03   Result Date: 02/03/2018 CLINICAL DATA:  82 year old female with a history of hepatitis EXAM: DUPLEX ULTRASOUND OF LIVER TECHNIQUE: Color and duplex Doppler ultrasound was performed to evaluate the hepatic in-flow and out-flow vessels. COMPARISON:  None. FINDINGS: Portal Vein Velocities Main:  32 cm/sec Right:  14 cm/sec Left:  11 cm/sec Hepatic Vein Velocities Right:  26 cm/sec Middle:  34 cm/sec Left:  37 cm/sec Hepatic Artery Velocity:  250 cm/sec Splenic Vein Velocity:  12 cm/sec Varices: Absent Ascites: Absent Mildly heterogeneous echotexture of liver parenchyma. IMPRESSION: Unremarkable duplex of the hepatic vasculature. Heterogeneous echotexture of liver parenchyma, nonspecific, and potentially representing fatty liver or other medical liver disease. Electronically Signed: By: Gilmer Mor D.O. On: 02/02/2018 08:46    ASSESSMENT AND PLAN:  *Acute febrile illness Resolved Secondary to unknown etiology Chest x-ray noted for bronchitis changes-patient without symptomatology, UA unimpressive-admitted for possible UTI/Rocephin discontinued Follow-up on blood cultures-negative thus far, procalcitonin .56, denies exposures to ticks-follow-up on [facility] spotted fever/Lyme disease, vitals per routine, continue close medical monitoring  *Acute on chronic memory deficits Daughter believes that the patient has dementia, patient had mini Folstein done on last week which was negative for dementia Stable/at  baseline Head CT unimpressive Neurology input appreciated-recommended outpatient neurology follow-up for memory evaluation  Ammonia level 40, consult if expert opinion, check ammonia level, MRI brain negative for any acute process  *Acute transaminitis Stable Most likely secondary to fatty liver-liver ultrasound noted  *Acute kidney injury with chronic kidney disease Resolved Most likely secondary to acute dehydration CKD most likely secondary to long-standing hypertension Continue to avoid nephrotoxic agents, renal ultrasound noted for chronic medical renal disease  *Acute on chronic pancytopenia Secondary to unknown etiology Oncology input appreciated-work-up in progress, abdominal ultrasound negative for splenomegaly, possible need for bone biopsy work-up is negative/inconclusive   *Acute hypokalemia Repleted  *Acute fatigue/generalized weakness Most likely secondary to dehydration Physical therapy input appreciated-for home health PT status post discharge   Disposition to home on tomorrow barring any complications  All the records are reviewed and case discussed with Care Management/Social Workerr. Management plans discussed with the patient, family and they are in agreement.  CODE STATUS: full  TOTAL TIME TAKING CARE OF THIS PATIENT: 35 minutes.     POSSIBLE D/C IN 1-2 DAYS, DEPENDING ON CLINICAL CONDITION.   Evelena Asa Kaelynne Christley M.D on 02/03/2018   Between 7am to 6pm - Pager - [phone]  After 6pm go to [email or web address] - Social research officer, government  [employer]  [employer]  Office  [phone]  CC: Primary care physician; [facility]  Note: This dictation was prepared with Dragon dictation along with smaller phrase technology. Any transcriptional errors that result from this process are unintentional.

## 2018-02-04 ENCOUNTER — Telehealth: Payer: Self-pay | Admitting: Internal Medicine

## 2018-02-04 ENCOUNTER — Telehealth: Payer: Self-pay | Admitting: *Deleted

## 2018-02-04 DIAGNOSIS — D649 Anemia, unspecified: Secondary | ICD-10-CM

## 2018-02-04 DIAGNOSIS — N179 Acute kidney failure, unspecified: Secondary | ICD-10-CM

## 2018-02-04 DIAGNOSIS — D696 Thrombocytopenia, unspecified: Secondary | ICD-10-CM

## 2018-02-04 LAB — CBC WITH DIFFERENTIAL/PLATELET
BAND NEUTROPHILS: 1 %
BASOS ABS: 0 10*3/uL (ref 0–0.1)
Basophils Relative: 0 %
Blasts: 0 %
EOS ABS: 0 10*3/uL (ref 0–0.7)
EOS PCT: 0 %
HEMATOCRIT: 30 % — AB (ref 35.0–47.0)
Hemoglobin: 10.1 g/dL — ABNORMAL LOW (ref 12.0–16.0)
LYMPHS ABS: 5 10*3/uL — AB (ref 1.0–3.6)
Lymphocytes Relative: 59 %
MCH: 30.4 pg (ref 26.0–34.0)
MCHC: 33.5 g/dL (ref 32.0–36.0)
MCV: 90.7 fL (ref 80.0–100.0)
METAMYELOCYTES PCT: 4 %
MONOS PCT: 9 %
Monocytes Absolute: 0.8 10*3/uL (ref 0.2–0.9)
Myelocytes: 0 %
NEUTROS ABS: 2.7 10*3/uL (ref 1.4–6.5)
Neutrophils Relative %: 27 %
Other: 0 %
Platelets: 89 10*3/uL — ABNORMAL LOW (ref 150–440)
Promyelocytes Relative: 0 %
RBC: 3.31 MIL/uL — AB (ref 3.80–5.20)
RDW: 15.3 % — AB (ref 11.5–14.5)
WBC: 8.5 10*3/uL (ref 3.6–11.0)
nRBC: 0 /100 WBC

## 2018-02-04 LAB — HEPATITIS PANEL, ACUTE
HEP A IGM: NEGATIVE
Hep B C IgM: NEGATIVE
Hepatitis B Surface Ag: NEGATIVE

## 2018-02-04 LAB — COMPREHENSIVE METABOLIC PANEL
ALBUMIN: 2.8 g/dL — AB (ref 3.5–5.0)
ALT: 106 U/L — ABNORMAL HIGH (ref 14–54)
ANION GAP: 6 (ref 5–15)
AST: 120 U/L — ABNORMAL HIGH (ref 15–41)
Alkaline Phosphatase: 70 U/L (ref 38–126)
BILIRUBIN TOTAL: 0.5 mg/dL (ref 0.3–1.2)
BUN: 16 mg/dL (ref 6–20)
CO2: 23 mmol/L (ref 22–32)
Calcium: 8.1 mg/dL — ABNORMAL LOW (ref 8.9–10.3)
Chloride: 108 mmol/L (ref 101–111)
Creatinine, Ser: 1.36 mg/dL — ABNORMAL HIGH (ref 0.44–1.00)
GFR calc non Af Amer: 36 mL/min — ABNORMAL LOW (ref >60)
GFR, EST AFRICAN AMERICAN: 42 mL/min — AB (ref >60)
GLUCOSE: 91 mg/dL (ref 65–99)
POTASSIUM: 3.9 mmol/L (ref 3.5–5.1)
Sodium: 137 mmol/L (ref 135–145)
TOTAL PROTEIN: 6 g/dL — AB (ref 6.5–8.1)

## 2018-02-04 LAB — KAPPA/LAMBDA LIGHT CHAINS
KAPPA FREE LGHT CHN: 73.7 mg/L — AB (ref 3.3–19.4)
Kappa, lambda light chain ratio: 1.66 — ABNORMAL HIGH (ref 0.26–1.65)
LAMDA FREE LIGHT CHAINS: 44.3 mg/L — AB (ref 5.7–26.3)

## 2018-02-04 LAB — ROCKY MTN SPOTTED FVR ABS PNL(IGG+IGM)
RMSF IgG: NEGATIVE
RMSF IgM: 0.52 index (ref 0.00–0.89)

## 2018-02-04 LAB — METHYLMALONIC ACID, SERUM: Methylmalonic Acid, Quantitative: 253 nmol/L (ref 0–378)

## 2018-02-04 LAB — RPR: RPR Ser Ql: NONREACTIVE

## 2018-02-04 LAB — LACTATE DEHYDROGENASE: LDH: 364 U/L — ABNORMAL HIGH (ref 98–192)

## 2018-02-04 LAB — HAPTOGLOBIN: Haptoglobin: 241 mg/dL — ABNORMAL HIGH (ref 34–200)

## 2018-02-04 NOTE — Telephone Encounter (Signed)
Called left message for [facility] to get records.  As, per available info on epic- also called [facility]- no pt info available.

## 2018-02-04 NOTE — Telephone Encounter (Signed)
Reviewed the blood work platelets-89/improved from 69 from yesterday; hemoglobin stable 10 /creatinine overall improved-1.36 [CKD/anemia]; haptoglobin normal/slightly elevated LDH-no evidence of hemolysis.  Slight continued abnormalities of LFTs; I suspect constellation of lab abnormalities-along with fevers-question viral etiology. Spoke to patient's daughter, Adria-regarding her mom's care  # Also discussed with Dr. Tildon Husky-   # Robin-please have the patient follow-up with me next Tuesday 5/21- with labs [cbc/cmp/ptt/ldh]  brooke- please order the labs- Thx

## 2018-02-04 NOTE — Telephone Encounter (Signed)
Called pt & LVM asking for call back. Left office number and hours in message. When she calls back, please inform her that her labs are normal.

## 2018-02-04 NOTE — Progress Notes (Signed)
Discharge instructions given and went over with patient and patients sister at bedside. All questions answered. Patient discharged home via wheelchair by volunteer services. Bo Mcclintock, RN

## 2018-02-04 NOTE — Discharge Summary (Addendum)
[employer] - [place] at [facility]   PATIENT NAME: Melissa Vazquez    MR#:  [id number]  DATE OF BIRTH:  September 11, 1936  DATE OF ADMISSION:  02/01/2018 ADMITTING PHYSICIAN: Marguarite Arbour, MD  DATE OF DISCHARGE: 02/04/2018  PRIMARY CARE PHYSICIAN: [facility]    ADMISSION DIAGNOSIS:  Weakness [R53.1] AKI (acute kidney injury) (HCC) [N17.9] Altered mental status, unspecified altered mental status type [R41.82] Pain in right leg [M79.604]  DISCHARGE DIAGNOSIS:  Principal Problem:   AKI (acute kidney injury) (HCC) Active Problems:      Diabetes (HCC)    SECONDARY DIAGNOSIS:   Past Medical History:  Diagnosis Date  . Cardiomyopathy (HCC)   . CHF (congestive heart failure) (HCC)   . Diabetes mellitus without complication (HCC)    prediabetic  . Hypertension   . Varicose vein of leg     HOSPITAL COURSE:  82 year old female who presented to the ER with confusion and fever.  1.  Acute febrile illness: This is likely viral in nature.  All work-up including UA and chest x-ray negative.  Blood cultures were negative.  2.  Cognitive impairment: Patient will have outpatient follow-up for cognitive impairment. She was evaluated by neurology while she was in the hospital.  MRI of the brain was negative for acute process.  3.  Acute transaminitis: This could be due to fatty liver seen on ultrasound  4.  Thrombocytopenia: This is likely due to viral etiology with bone marrow suppression.  Platelet count has improving.  Patient will follow-up on Friday with oncology.  She was evaluated by the oncologist while in the hospital. Ultrasound was negative for splenomegaly. 5.  Chronic anemia: Hemolysis panel was negative. Patient will follow-up with oncology.  6.  Acute kidney injury in the setting of dehydration which has improved with IV fluids  7.  Essential hypertension: Patient will continue on Norvasc and metoprolol.  HCTZ/triamterene has been  discontinued for now.  She will need follow-up with her blood pressure.  If her blood pressure is elevated then she may resume HCTZ/triamterene. DISCHARGE CONDITIONS AND DIET:   Stable for discharge  CONSULTS OBTAINED:  Treatment Team:  Anson Fret, MD Thana Farr, MD Earna Coder, MD  DRUG ALLERGIES:  No Known Allergies  DISCHARGE MEDICATIONS:   Allergies as of 02/04/2018   No Known Allergies     Medication List    STOP taking these medications   potassium chloride 10 MEQ tablet Commonly known as:  K-DUR   triamterene-hydrochlorothiazide 37.5-25 MG tablet Commonly known as:  MAXZIDE-25     TAKE these medications   amLODipine 5 MG tablet Commonly known as:  NORVASC Take 5 mg by mouth daily.   metoprolol tartrate 25 MG tablet Commonly known as:  LOPRESSOR Take 25 mg by mouth 2 (two) times daily.         Today   CHIEF COMPLAINT:   Doing well and ready for discharge home   VITAL SIGNS:  Blood pressure 136/66, pulse 67, temperature 97.8 F (36.6 C), temperature source Oral, resp. rate 15, height 5' 5.5" (1.664 m), weight 89.1 kg (196 lb 6.4 oz), SpO2 100 %.   REVIEW OF SYSTEMS:  Review of Systems  Constitutional: Negative.  Negative for chills, fever and malaise/fatigue.  HENT: Negative.  Negative for ear discharge, ear pain, hearing loss, nosebleeds and sore throat.   Eyes: Negative.  Negative for blurred vision and pain.  Respiratory: Negative.  Negative for cough, hemoptysis, shortness  of breath and wheezing.   Cardiovascular: Negative.  Negative for chest pain, palpitations and leg swelling.  Gastrointestinal: Negative.  Negative for abdominal pain, blood in stool, diarrhea, nausea and vomiting.  Genitourinary: Negative.  Negative for dysuria.  Musculoskeletal: Negative.  Negative for back pain.  Skin: Negative.   Neurological: Negative for dizziness, tremors, speech change, focal weakness, seizures and headaches.   Endo/Heme/Allergies: Negative.  Does not bruise/bleed easily.  Psychiatric/Behavioral: Negative.  Negative for depression, hallucinations and suicidal ideas.     PHYSICAL EXAMINATION:  GENERAL:  82 y.o.-year-old patient lying in the bed with no acute distress.  NECK:  Supple, no jugular venous distention. No thyroid enlargement, no tenderness.  LUNGS: Normal breath sounds bilaterally, no wheezing, rales,rhonchi  No use of accessory muscles of respiration.  CARDIOVASCULAR: S1, S2 normal. No murmurs, rubs, or gallops.  ABDOMEN: Soft, non-tender, non-distended. Bowel sounds present. No organomegaly or mass.  EXTREMITIES: No pedal edema, cyanosis, or clubbing.  PSYCHIATRIC: The patient is alert and oriented x 3.  SKIN: No obvious rash, lesion, or ulcer.   DATA REVIEW:   CBC Recent Labs  Lab 02/04/18 0242  WBC 8.5  HGB 10.1*  HCT 30.0*  PLT 89*    Chemistries  Recent Labs  Lab 02/02/18 0847  02/04/18 0242  NA  --    < > 137  K  --    < > 3.9  CL  --    < > 108  CO2  --    < > 23  GLUCOSE  --    < > 91  BUN  --    < > 16  CREATININE  --    < > 1.36*  CALCIUM  --    < > 8.1*  MG 2.1  --   --   AST  --    < > 120*  ALT  --    < > 106*  ALKPHOS  --    < > 70  BILITOT  --    < > 0.5   < > = values in this interval not displayed.    Cardiac Enzymes Recent Labs  Lab 02/01/18 1144  TROPONINI <0.03    Microbiology Results  @  RADIOLOGY:  Mr Brain Wo Contrast  Result Date: 02/02/2018 CLINICAL DATA:  Acute presentation with mental status changes and confusion. EXAM: MRI HEAD WITHOUT CONTRAST TECHNIQUE: Multiplanar, multiecho pulse sequences of the brain and surrounding structures were obtained without intravenous contrast. COMPARISON:  CT 02/01/2018 FINDINGS: Brain: Diffusion imaging does not show any acute or subacute infarction. The brainstem and cerebellum are normal. Cerebral hemispheres show moderate chronic small-vessel ischemic changes of the  hemispheric white matter. No cortical or large vessel territory infarction. No mass lesion, hemorrhage, hydrocephalus or extra-axial collection. Vascular: Major vessels at the base of the brain show flow. Skull and upper cervical spine: Negative Sinuses/Orbits: Clear/normal Other: None IMPRESSION: No acute or reversible finding. Moderate chronic small-vessel ischemic changes of the cerebral hemispheric white matter. Electronically Signed   By: Paulina Fusi M.D.   On: 02/02/2018 14:15      Allergies as of 02/04/2018   No Known Allergies     Medication List    STOP taking these medications   potassium chloride 10 MEQ tablet Commonly known as:  K-DUR   triamterene-hydrochlorothiazide 37.5-25 MG tablet Commonly known as:  MAXZIDE-25     TAKE these medications   amLODipine 5 MG tablet Commonly known as:  NORVASC Take 5 mg by  mouth daily.   metoprolol tartrate 25 MG tablet Commonly known as:  LOPRESSOR Take 25 mg by mouth 2 (two) times daily.         D/w daughter and Dr B Management plans discussed with the patient and she is in agreement. Stable for discharge home  Patient should follow up with pcp  CODE STATUS:     Code Status Orders  (From admission, onward)        Start     Ordered   02/01/18 2007  Full code  Continuous     02/01/18 2007    Code Status History    This patient has a current code status but no historical code status.      TOTAL TIME TAKING CARE OF THIS PATIENT: 38 minutes.    Note: This dictation was prepared with Dragon dictation along with smaller phrase technology. Any transcriptional errors that result from this process are unintentional.  Jasdeep Kepner M.D on 02/04/2018 at 9:07 AM  Between 7am to 6pm - Pager - [phone] After 6pm go to [email or web address] - Social research officer, government  [facility]  Office  [phone]  CC: Primary care physician; [facility]

## 2018-02-04 NOTE — Telephone Encounter (Signed)
-----   Message from Anson Fret, MD sent at 02/04/2018  4:57 PM EDT ----- Labs normal thanks

## 2018-02-04 NOTE — Addendum Note (Signed)
Addended by: Georgina Snell R on: 02/04/2018 10:40 AM   Modules accepted: Orders

## 2018-02-04 NOTE — Telephone Encounter (Signed)
Called pt got VM, left appt on VM and asked her to call back to confirm

## 2018-02-04 NOTE — Telephone Encounter (Signed)
Lab orders entered

## 2018-02-04 NOTE — Telephone Encounter (Signed)
Reviewed the labs from PCPs office from March 2019-creatinine 1.22; bilirubin normal AST ALT -wnl ; hemoglobin 11.3 platelets 247. Follow up as planned next week in [facility].

## 2018-02-05 LAB — MULTIPLE MYELOMA PANEL, SERUM
ALPHA 1: 0.2 g/dL (ref 0.0–0.4)
ALPHA2 GLOB SERPL ELPH-MCNC: 0.9 g/dL (ref 0.4–1.0)
Albumin SerPl Elph-Mcnc: 2.8 g/dL — ABNORMAL LOW (ref 2.9–4.4)
Albumin/Glob SerPl: 1 (ref 0.7–1.7)
B-Globulin SerPl Elph-Mcnc: 0.9 g/dL (ref 0.7–1.3)
Gamma Glob SerPl Elph-Mcnc: 1.1 g/dL (ref 0.4–1.8)
Globulin, Total: 3.1 g/dL (ref 2.2–3.9)
IGA: 250 mg/dL (ref 64–422)
IGG (IMMUNOGLOBIN G), SERUM: 1226 mg/dL (ref 700–1600)
IGM (IMMUNOGLOBULIN M), SRM: 101 mg/dL (ref 26–217)
TOTAL PROTEIN ELP: 5.9 g/dL — AB (ref 6.0–8.5)

## 2018-02-05 LAB — PTT FACTOR INHIBITOR (MIXING STUDY)
APTT 11 NP INCUB. MIX CTL: 32.1 s — AB (ref 22.9–30.2)
APTT 11 NP MIX, 60 MIN, INCUB.: 32.2 s — AB (ref 22.9–30.2)
APTT: 34.8 s — AB (ref 22.9–30.2)
aPTT 1:1 Normal Plasma: 30.1 s (ref 22.9–30.2)

## 2018-02-05 LAB — ZINC: ZINC: 69 ug/dL (ref 56–134)

## 2018-02-05 LAB — COPPER, SERUM: Copper: 130 ug/dL (ref 72–166)

## 2018-02-05 LAB — LYME DISEASE DNA BY PCR(BORRELIA BURG): LYME DISEASE(B. BURGDORFERI) PCR: NEGATIVE

## 2018-02-06 ENCOUNTER — Encounter: Payer: Self-pay | Admitting: Nurse Practitioner

## 2018-02-06 LAB — VITAMIN B1: VITAMIN B1 (THIAMINE): 80.5 nmol/L (ref 66.5–200.0)

## 2018-02-06 NOTE — Telephone Encounter (Signed)
Tried to call the patient once more about a normal lab that was done in the hospital. LVM asking for call back.

## 2018-02-07 LAB — CULTURE, BLOOD (ROUTINE X 2)
CULTURE: NO GROWTH
Culture: NO GROWTH
SPECIAL REQUESTS: ADEQUATE

## 2018-02-10 ENCOUNTER — Inpatient Hospital Stay: Payer: Medicare Other | Attending: Internal Medicine | Admitting: Internal Medicine

## 2018-02-10 ENCOUNTER — Inpatient Hospital Stay: Payer: Medicare Other

## 2018-02-10 ENCOUNTER — Encounter: Payer: Self-pay | Admitting: Internal Medicine

## 2018-02-10 DIAGNOSIS — I509 Heart failure, unspecified: Secondary | ICD-10-CM | POA: Diagnosis not present

## 2018-02-10 DIAGNOSIS — N179 Acute kidney failure, unspecified: Secondary | ICD-10-CM

## 2018-02-10 DIAGNOSIS — D631 Anemia in chronic kidney disease: Secondary | ICD-10-CM

## 2018-02-10 DIAGNOSIS — M79652 Pain in left thigh: Secondary | ICD-10-CM

## 2018-02-10 DIAGNOSIS — M79605 Pain in left leg: Secondary | ICD-10-CM | POA: Insufficient documentation

## 2018-02-10 DIAGNOSIS — N183 Chronic kidney disease, stage 3 unspecified: Secondary | ICD-10-CM | POA: Insufficient documentation

## 2018-02-10 DIAGNOSIS — Z79899 Other long term (current) drug therapy: Secondary | ICD-10-CM | POA: Insufficient documentation

## 2018-02-10 DIAGNOSIS — D696 Thrombocytopenia, unspecified: Secondary | ICD-10-CM

## 2018-02-10 DIAGNOSIS — D509 Iron deficiency anemia, unspecified: Secondary | ICD-10-CM | POA: Diagnosis not present

## 2018-02-10 DIAGNOSIS — D649 Anemia, unspecified: Secondary | ICD-10-CM

## 2018-02-10 DIAGNOSIS — E119 Type 2 diabetes mellitus without complications: Secondary | ICD-10-CM | POA: Diagnosis not present

## 2018-02-10 DIAGNOSIS — I10 Essential (primary) hypertension: Secondary | ICD-10-CM | POA: Diagnosis not present

## 2018-02-10 LAB — COMPREHENSIVE METABOLIC PANEL
ALK PHOS: 83 U/L (ref 38–126)
ALT: 196 U/L — AB (ref 14–54)
AST: 112 U/L — AB (ref 15–41)
Albumin: 3.7 g/dL (ref 3.5–5.0)
Anion gap: 9 (ref 5–15)
BILIRUBIN TOTAL: 0.6 mg/dL (ref 0.3–1.2)
BUN: 15 mg/dL (ref 6–20)
CO2: 23 mmol/L (ref 22–32)
CREATININE: 1.18 mg/dL — AB (ref 0.44–1.00)
Calcium: 8.8 mg/dL — ABNORMAL LOW (ref 8.9–10.3)
Chloride: 107 mmol/L (ref 101–111)
GFR calc Af Amer: 49 mL/min — ABNORMAL LOW (ref >60)
GFR, EST NON AFRICAN AMERICAN: 43 mL/min — AB (ref >60)
Glucose, Bld: 88 mg/dL (ref 65–99)
Potassium: 4 mmol/L (ref 3.5–5.1)
Sodium: 139 mmol/L (ref 135–145)
TOTAL PROTEIN: 7.3 g/dL (ref 6.5–8.1)

## 2018-02-10 LAB — CBC WITH DIFFERENTIAL/PLATELET
BASOS ABS: 0.1 10*3/uL (ref 0–0.1)
Basophils Relative: 1 %
EOS ABS: 0.1 10*3/uL (ref 0–0.7)
Eosinophils Relative: 1 %
HCT: 30.5 % — ABNORMAL LOW (ref 35.0–47.0)
Hemoglobin: 10.3 g/dL — ABNORMAL LOW (ref 12.0–16.0)
Lymphocytes Relative: 58 %
Lymphs Abs: 4.9 10*3/uL — ABNORMAL HIGH (ref 1.0–3.6)
MCH: 30.3 pg (ref 26.0–34.0)
MCHC: 33.6 g/dL (ref 32.0–36.0)
MCV: 90 fL (ref 80.0–100.0)
Monocytes Absolute: 1.2 10*3/uL — ABNORMAL HIGH (ref 0.2–0.9)
Monocytes Relative: 14 %
Neutro Abs: 2.1 10*3/uL (ref 1.4–6.5)
Neutrophils Relative %: 26 %
Platelets: 322 10*3/uL (ref 150–440)
RBC: 3.39 MIL/uL — AB (ref 3.80–5.20)
RDW: 15.6 % — ABNORMAL HIGH (ref 11.5–14.5)
WBC: 8.4 10*3/uL (ref 3.6–11.0)

## 2018-02-10 LAB — LACTATE DEHYDROGENASE: LDH: 289 U/L — AB (ref 98–192)

## 2018-02-10 LAB — APTT: aPTT: 32 seconds (ref 24–36)

## 2018-02-10 NOTE — Progress Notes (Signed)
[facility] CONSULT NOTE  Patient Care Team: Lucia Estelle, NP as PCP - General (Nurse Practitioner)  CHIEF COMPLAINTS/PURPOSE OF CONSULTATION:  Thrombocytopenia/anemia  #   No history exists.     HISTORY OF PRESENTING ILLNESS:  Melissa Vazquez 82 y.o.  female was recently evaluated in the hospital for thrombocytopenia/anemia/renal failure/fevers/elevated LFTs.  Extensive work-up including work-up for hemolysis/viral causes-was negative.  At the time of discharge patient's platelets 89; renal function improved from 2.5 at presentation to 1.4.  Patient continues to complain of left thigh pain especially with movement.  Improved with ibuprofen-for the last 2 months.  No swelling in the leg.  No skin rash.  Review of Systems  Constitutional: Negative for chills, diaphoresis, fever, malaise/fatigue and weight loss.  HENT: Negative for nosebleeds and sore throat.   Eyes: Negative for double vision.  Respiratory: Negative for cough, hemoptysis, sputum production, shortness of breath and wheezing.   Cardiovascular: Positive for leg swelling. Negative for chest pain, palpitations and orthopnea.  Gastrointestinal: Negative for abdominal pain, blood in stool, constipation, diarrhea, heartburn, melena, nausea and vomiting.  Genitourinary: Negative for dysuria, frequency and urgency.  Musculoskeletal: Positive for myalgias. Negative for back pain and joint pain.  Skin: Negative.  Negative for itching and rash.  Neurological: Negative for dizziness, tingling, focal weakness, weakness and headaches.  Endo/Heme/Allergies: Does not bruise/bleed easily.  Psychiatric/Behavioral: Negative for depression. The patient is not nervous/anxious and does not have insomnia.      MEDICAL HISTORY:  Past Medical History:  Diagnosis Date  . Cardiomyopathy (HCC)   . CHF (congestive heart failure) (HCC)   . Diabetes mellitus without complication (HCC)    prediabetic  . Hypertension   . Varicose  vein of leg     SURGICAL HISTORY: Past Surgical History:  Procedure Laterality Date  . CESAREAN SECTION    . NECK SURGERY    . PARTIAL HYSTERECTOMY      SOCIAL HISTORY: Social History   Socioeconomic History  . Marital status: Single    Spouse name: Not on file  . Number of children: Not on file  . Years of education: Not on file  . Highest education level: Not on file  Occupational History  . Not on file  Social Needs  . Financial resource strain: Not on file  . Food insecurity:    Worry: Not on file    Inability: Not on file  . Transportation needs:    Medical: Not on file    Non-medical: Not on file  Tobacco Use  . Smoking status: Never Smoker  . Smokeless tobacco: Never Used  Substance and Sexual Activity  . Alcohol use: Never    Frequency: Never  . Drug use: Never  . Sexual activity: Not on file  Lifestyle  . Physical activity:    Days per week: Not on file    Minutes per session: Not on file  . Stress: Not on file  Relationships  . Social connections:    Talks on phone: Not on file    Gets together: Not on file    Attends religious service: Not on file    Active member of club or organization: Not on file    Attends meetings of clubs or organizations: Not on file    Relationship status: Not on file  . Intimate partner violence:    Fear of current or ex partner: Not on file    Emotionally abused: Not on file    Physically abused: Not  on file    Forced sexual activity: Not on file  Other Topics Concern  . Not on file  Social History Narrative  . Not on file    FAMILY HISTORY: Family History  Problem Relation Age of Onset  . Cancer Mother        Breast cancer    ALLERGIES:  has No Known Allergies.  MEDICATIONS:  Current Outpatient Medications  Medication Sig Dispense Refill  . amLODipine (NORVASC) 5 MG tablet Take 5 mg by mouth daily.    . metoprolol tartrate (LOPRESSOR) 25 MG tablet Take 25 mg by mouth 2 (two) times daily.     No  current facility-administered medications for this visit.       Marland Kitchen  PHYSICAL EXAMINATION: ECOG PERFORMANCE STATUS: 0 - Asymptomatic  Vitals:   02/10/18 1513  BP: (!) 149/78  Pulse: 62  Resp: 16  Temp: 97.6 F (36.4 C)   Filed Weights   02/10/18 1513  Weight: 185 lb (83.9 kg)    Physical Exam  Constitutional: She is oriented to person, place, and time and well-developed, well-nourished, and in no distress.  HENT:  Head: Normocephalic and atraumatic.  Mouth/Throat: Oropharynx is clear and moist. No oropharyngeal exudate.  Eyes: Pupils are equal, round, and reactive to light.  Neck: Normal range of motion. Neck supple.  Cardiovascular: Normal rate and regular rhythm.  Pulmonary/Chest: No respiratory distress. She has no wheezes.  Abdominal: Soft. Bowel sounds are normal. She exhibits no distension and no mass. There is no tenderness. There is no rebound and no guarding.  Musculoskeletal: Normal range of motion. She exhibits tenderness. She exhibits no edema.  Neurological: She is alert and oriented to person, place, and time.  Skin: Skin is warm.  Psychiatric: Affect normal.     LABORATORY DATA:  I have reviewed the data as listed Lab Results  Component Value Date   WBC 8.4 02/10/2018   HGB 10.3 (L) 02/10/2018   HCT 30.5 (L) 02/10/2018   MCV 90.0 02/10/2018   PLT 322 02/10/2018   Recent Labs    02/03/18 0403 02/04/18 0242 02/10/18 1440  NA 136 137 139  K 3.8 3.9 4.0  CL 109 108 107  CO2 21* 23 23  GLUCOSE 93 91 88  BUN CREATININE 1.42* 1.36* 1.18*  CALCIUM 7.9* 8.1* 8.8*  GFRNONAA 34* 36* 43*  GFRAA 40* 42* 49*  PROT 6.0* 6.0* 7.3  ALBUMIN 3.0* 2.8* 3.7  AST 136* 120* 112*  ALT 112* 106* 196*  ALKPHOS 76 70 83  BILITOT 0.5 0.5 0.6    RADIOGRAPHIC STUDIES: I have personally reviewed the radiological images as listed and agreed with the findings in the report. Dg Chest 2 View  Result Date: 02/01/2018 CLINICAL DATA:  Increasing weakness  for 3 days, unable to stand, history CHF, hypertension EXAM: CHEST - 2 VIEW COMPARISON:  None FINDINGS: Normal heart size, mediastinal contours, and pulmonary vascularity. Atherosclerotic calcification aorta. Mild central peribronchial thickening. Lungs otherwise clear. No infiltrate, pleural effusion or pneumothorax. Multilevel endplate spur formation thoracic spine. IMPRESSION: Mild bronchitic changes without acute infiltrate. Electronically Signed   By: Ulyses Southward M.D.   On: 02/01/2018 13:38   Ct Head Wo Contrast  Result Date: 02/01/2018 CLINICAL DATA:  82 year old female with head and neck injury following fall several nights ago. Initial encounter. EXAM: CT HEAD WITHOUT CONTRAST CT CERVICAL SPINE WITHOUT CONTRAST TECHNIQUE: Multidetector CT imaging of the head and cervical spine was performed following the standard  protocol without intravenous contrast. Multiplanar CT image reconstructions of the cervical spine were also generated. COMPARISON:  None. FINDINGS: CT HEAD FINDINGS Brain: No evidence of acute infarction, hemorrhage, hydrocephalus, extra-axial collection or mass lesion/mass effect. Minimal chronic small-vessel white matter ischemic changes noted. Vascular: Atherosclerotic calcifications identified. Skull: No acute abnormality Sinuses/Orbits: No acute abnormality Other: None CT CERVICAL SPINE FINDINGS Alignment: Normal. Skull base and vertebrae: No acute fracture. No primary bone lesion or focal pathologic process. Soft tissues and spinal canal: No prevertebral fluid or swelling. No visible canal hematoma. Disc levels: Moderate multilevel degenerative disc disease and spondylosis throughout the cervical spine noted contributing to central spinal and bony foraminal narrowing at multiple levels. Upper chest: No acute abnormality Other: None IMPRESSION: 1. No evidence of acute intracranial abnormality. Minimal chronic small-vessel white matter ischemic changes. 2. No static evidence of acute injury  to the cervical spine. Moderate multilevel degenerative disc disease/spondylosis. Electronically Signed   By: Harmon Pier M.D.   On: 02/01/2018 17:33   Ct Cervical Spine Wo Contrast  Result Date: 02/01/2018 CLINICAL DATA:  82 year old female with head and neck injury following fall several nights ago. Initial encounter. EXAM: CT HEAD WITHOUT CONTRAST CT CERVICAL SPINE WITHOUT CONTRAST TECHNIQUE: Multidetector CT imaging of the head and cervical spine was performed following the standard protocol without intravenous contrast. Multiplanar CT image reconstructions of the cervical spine were also generated. COMPARISON:  None. FINDINGS: CT HEAD FINDINGS Brain: No evidence of acute infarction, hemorrhage, hydrocephalus, extra-axial collection or mass lesion/mass effect. Minimal chronic small-vessel white matter ischemic changes noted. Vascular: Atherosclerotic calcifications identified. Skull: No acute abnormality Sinuses/Orbits: No acute abnormality Other: None CT CERVICAL SPINE FINDINGS Alignment: Normal. Skull base and vertebrae: No acute fracture. No primary bone lesion or focal pathologic process. Soft tissues and spinal canal: No prevertebral fluid or swelling. No visible canal hematoma. Disc levels: Moderate multilevel degenerative disc disease and spondylosis throughout the cervical spine noted contributing to central spinal and bony foraminal narrowing at multiple levels. Upper chest: No acute abnormality Other: None IMPRESSION: 1. No evidence of acute intracranial abnormality. Minimal chronic small-vessel white matter ischemic changes. 2. No static evidence of acute injury to the cervical spine. Moderate multilevel degenerative disc disease/spondylosis. Electronically Signed   By: Harmon Pier M.D.   On: 02/01/2018 17:33   Mr Brain Wo Contrast  Result Date: 02/02/2018 CLINICAL DATA:  Acute presentation with mental status changes and confusion. EXAM: MRI HEAD WITHOUT CONTRAST TECHNIQUE: Multiplanar,  multiecho pulse sequences of the brain and surrounding structures were obtained without intravenous contrast. COMPARISON:  CT 02/01/2018 FINDINGS: Brain: Diffusion imaging does not show any acute or subacute infarction. The brainstem and cerebellum are normal. Cerebral hemispheres show moderate chronic small-vessel ischemic changes of the hemispheric white matter. No cortical or large vessel territory infarction. No mass lesion, hemorrhage, hydrocephalus or extra-axial collection. Vascular: Major vessels at the base of the brain show flow. Skull and upper cervical spine: Negative Sinuses/Orbits: Clear/normal Other: None IMPRESSION: No acute or reversible finding. Moderate chronic small-vessel ischemic changes of the cerebral hemispheric white matter. Electronically Signed   By: Paulina Fusi M.D.   On: 02/02/2018 14:15   US Renal  Result Date: 02/02/2018 CLINICAL DATA:  Acute kidney injury, history cardiomyopathy, CHF, diabetes mellitus, hypertension EXAM: RENAL / URINARY TRACT ULTRASOUND COMPLETE COMPARISON:  None FINDINGS: Right Kidney: Length: 11.0 cm. Normal cortical thickness. Increased cortical echogenicity. Cysts identified, largest laterally 3.8 x 3.6 x 4.7 cm, simple in character. No hydronephrosis or solid mass. No  definite shadowing calcification. Left Kidney: Length: 11.6 cm. Normal cortical thickness. Increased cortical echogenicity. Small cysts identified, largest at inferior pole 3.2 x 2.8 x 3.2 cm, simple in character. No solid mass, hydronephrosis or shadowing calcification. Bladder: Appears normal for degree of bladder distention. IMPRESSION: Medical renal disease changes of both kidneys. BILATERAL renal cysts. Electronically Signed   By: Ulyses Southward M.D.   On: 02/02/2018 08:37   US Venous Img Lower Unilateral Right  Result Date: 02/01/2018 CLINICAL DATA:  RIGHT leg pain for 2 months EXAM: RIGHT LOWER EXTREMITY VENOUS DOPPLER ULTRASOUND TECHNIQUE: Gray-scale sonography with graded  compression, as well as color Doppler and duplex ultrasound were performed to evaluate the lower extremity deep venous systems from the level of the common femoral vein and including the common femoral, femoral, profunda femoral, popliteal and calf veins including the posterior tibial, peroneal and gastrocnemius veins when visible. The superficial great saphenous vein was also interrogated. Spectral Doppler was utilized to evaluate flow at rest and with distal augmentation maneuvers in the common femoral, femoral and popliteal veins. COMPARISON:  None FINDINGS: Contralateral Common Femoral Vein: Respiratory phasicity is normal and symmetric with the symptomatic side. No evidence of thrombus. Normal compressibility. Common Femoral Vein: No evidence of thrombus. Normal compressibility, respiratory phasicity and response to augmentation. Saphenofemoral Junction: No evidence of thrombus. Normal compressibility and flow on color Doppler imaging. Profunda Femoral Vein: No evidence of thrombus. Normal compressibility and flow on color Doppler imaging. Femoral Vein: No evidence of thrombus. Normal compressibility, respiratory phasicity and response to augmentation. Popliteal Vein: No evidence of thrombus. Normal compressibility, respiratory phasicity and response to augmentation. Calf Veins: No evidence of thrombus. Normal compressibility and flow on color Doppler imaging. Superficial Great Saphenous Vein: No evidence of thrombus. Normal compressibility. Venous Reflux:  None. Other Findings:  None. IMPRESSION: No evidence of deep venous thrombosis in the RIGHT lower extremity. Electronically Signed   By: Ulyses Southward M.D.   On: 02/01/2018 13:58   US Liver Doppler  Addendum Date: 02/03/2018   ADDENDUM REPORT: 02/03/2018 09:03 ADDENDUM: Normal spleen size: 5.6 x 4.3 x 6.5 cm (volume = 82 cm^3) Electronically Signed   By: Corlis Leak M.D.   On: 02/03/2018 09:03   Result Date: 02/03/2018 CLINICAL DATA:  82 year old female  with a history of hepatitis EXAM: DUPLEX ULTRASOUND OF LIVER TECHNIQUE: Color and duplex Doppler ultrasound was performed to evaluate the hepatic in-flow and out-flow vessels. COMPARISON:  None. FINDINGS: Portal Vein Velocities Main:  32 cm/sec Right:  14 cm/sec Left:  11 cm/sec Hepatic Vein Velocities Right:  26 cm/sec Middle:  34 cm/sec Left:  37 cm/sec Hepatic Artery Velocity:  250 cm/sec Splenic Vein Velocity:  12 cm/sec Varices: Absent Ascites: Absent Mildly heterogeneous echotexture of liver parenchyma. IMPRESSION: Unremarkable duplex of the hepatic vasculature. Heterogeneous echotexture of liver parenchyma, nonspecific, and potentially representing fatty liver or other medical liver disease. Electronically Signed: By: Gilmer Mor D.O. On: 02/02/2018 08:46    ASSESSMENT & PLAN:   Anemia due to stage 3 chronic kidney disease (HCC) # Iron def anemia/ CKD- Hb 10.3 stable. Iron sat- 10%; continue PO iron  # Thrombocytopenia- improved/resolved.  Likely viral infection.  # Left Leg/thigh pain-  Right LE may 2019- [place]- NEG. check left lower extremity ultrasound; and also left femur x-ray.   # Elevated LFTs- worsened;  Liver US- Normal; hepatitis work up-NEG. ? Etiology.  Recommend further work-up with GI/MRCP etc.  Defer to PCP  #The above plan of care was  discussed with the patient/son and daughter in detail.  Also discussed with patient's PCP Ms.feng Zheng at length.   # 25 minutes face-to-face with the patient discussing the above plan of care; more than 50% of time spent on prognosis/ natural history; counseling and coordination.   All questions were answered. The patient knows to call the clinic with any problems, questions or concerns.    Earna Coder, MD 02/10/2018 4:29 PM

## 2018-02-10 NOTE — Assessment & Plan Note (Addendum)
#   Iron def anemia/ CKD- Hb 10.3 stable. Iron sat- 10%; continue PO iron  # Thrombocytopenia- improved/resolved.  Likely viral infection.  # Left Leg/thigh pain-  Right LE may 2019- [place]- NEG. check left lower extremity ultrasound; and also left femur x-ray.   # Elevated LFTs- worsened;  Liver US- Normal; hepatitis work up-NEG. ? Etiology.  Recommend further work-up with GI/MRCP etc.  Defer to PCP  #The above plan of care was discussed with the patient/son and daughter in detail.  Also discussed with patient's PCP Ms.feng Zheng at length.   # 25 minutes face-to-face with the patient discussing the above plan of care; more than 50% of time spent on prognosis/ natural history; counseling and coordination.

## 2018-02-11 ENCOUNTER — Ambulatory Visit
Admission: RE | Admit: 2018-02-11 | Discharge: 2018-02-11 | Disposition: A | Discharge status: Home / Self Care | Payer: Medicare Other | Source: Ambulatory Visit | Attending: Internal Medicine | Admitting: Internal Medicine

## 2018-02-11 ENCOUNTER — Ambulatory Visit: Payer: Medicare Other

## 2018-02-11 ENCOUNTER — Ambulatory Visit: Payer: Medicare Other | Admitting: Internal Medicine

## 2018-02-11 ENCOUNTER — Other Ambulatory Visit: Payer: Medicare Other

## 2018-02-11 DIAGNOSIS — M79652 Pain in left thigh: Secondary | ICD-10-CM | POA: Insufficient documentation

## 2018-02-11 DIAGNOSIS — M7989 Other specified soft tissue disorders: Secondary | ICD-10-CM | POA: Diagnosis not present

## 2018-02-12 ENCOUNTER — Telehealth: Payer: Self-pay | Admitting: Internal Medicine

## 2018-02-12 NOTE — Telephone Encounter (Signed)
Please inform patient ultrasound of the leg was negative for blood clots; no new recommendations follow-up as planned. thx

## 2018-02-12 NOTE — Telephone Encounter (Signed)
Patient notified of these results and verbalized understanding.  

## 2018-03-03 DIAGNOSIS — I517 Cardiomegaly: Secondary | ICD-10-CM | POA: Diagnosis not present

## 2018-03-03 DIAGNOSIS — I340 Nonrheumatic mitral (valve) insufficiency: Secondary | ICD-10-CM | POA: Diagnosis not present

## 2018-03-03 DIAGNOSIS — I5189 Other ill-defined heart diseases: Secondary | ICD-10-CM | POA: Diagnosis not present

## 2018-03-03 DIAGNOSIS — I361 Nonrheumatic tricuspid (valve) insufficiency: Secondary | ICD-10-CM | POA: Diagnosis not present

## 2018-03-18 DIAGNOSIS — G3184 Mild cognitive impairment, so stated: Secondary | ICD-10-CM | POA: Diagnosis not present

## 2018-03-18 DIAGNOSIS — Z862 Personal history of diseases of the blood and blood-forming organs and certain disorders involving the immune mechanism: Secondary | ICD-10-CM | POA: Diagnosis not present

## 2018-03-18 DIAGNOSIS — N183 Chronic kidney disease, stage 3 (moderate): Secondary | ICD-10-CM | POA: Diagnosis not present

## 2018-04-08 ENCOUNTER — Encounter: Payer: Self-pay | Admitting: Gastroenterology

## 2018-04-08 ENCOUNTER — Ambulatory Visit (INDEPENDENT_AMBULATORY_CARE_PROVIDER_SITE_OTHER): Payer: Medicare Other | Admitting: Gastroenterology

## 2018-04-08 ENCOUNTER — Other Ambulatory Visit
Admission: RE | Admit: 2018-04-08 | Discharge: 2018-04-08 | Disposition: A | Discharge status: Home / Self Care | Payer: Medicare Other | Source: Ambulatory Visit | Attending: Gastroenterology | Admitting: Gastroenterology

## 2018-04-08 VITALS — BP 117/67 | HR 84 | Ht 65.5 in | Wt 182.4 lb

## 2018-04-08 DIAGNOSIS — R748 Abnormal levels of other serum enzymes: Secondary | ICD-10-CM | POA: Diagnosis not present

## 2018-04-08 LAB — HEPATIC FUNCTION PANEL
ALK PHOS: 63 U/L (ref 38–126)
ALT: 18 U/L (ref 0–44)
AST: 25 U/L (ref 15–41)
Albumin: 3.9 g/dL (ref 3.5–5.0)
BILIRUBIN TOTAL: 0.8 mg/dL (ref 0.3–1.2)
Bilirubin, Direct: <0.1 mg/dL (ref 0.0–0.2)
TOTAL PROTEIN: 7.8 g/dL (ref 6.5–8.1)

## 2018-04-08 NOTE — Progress Notes (Signed)
Gastroenterology Consultation  Referring Provider:     Lucia Estelle, NP Primary Care Physician:  Melissa Estelle, NP Primary Gastroenterologist:  Dr. Servando Vazquez     Reason for Consultation:     Abnormal liver enzymes        HPI:   Melissa Vazquez is a 82 y.o. y/o female referred for consultation & management of Abnormal liver enzymes by Dr. Lucia Estelle, NP.  This patient comes today with a history of abnormal liver enzymes.  This patient was found to have elevated liver enzymes back in May 12 and was admitted to the hospital for 3 days.  The patient had AST of 125 with ALT of 104 that was consistently elevated around these levels for the 3 days she was in the hospital.  The patient also had them rechecked a week later on May 20 with the AST of 112 and the ALT of 196.  The patient's alkaline phosphatase and bilirubin has remained normal throughout the episodes of abnormal liver enzymes.  The patient had normal liver enzymes back in March.  She does report that she was taking a supplement that she brought over the Internet around that time but has since stopped taking it.  The patient cannot recall what the name of it was or what she was taking it for.  She denies any abdominal pain nausea vomiting fevers or chills.  During that hospital admission the patient also was found to have profound thrombocytopenia of unknown etiology.  The patient was followed by hematology who recommended her seeing me for the abnormal liver enzymes.  Past Medical History:  Diagnosis Date  . Cardiomyopathy (HCC)   . CHF (congestive heart failure) (HCC)   . Diabetes mellitus without complication (HCC)    prediabetic  . Hypertension   . Varicose vein of leg     Past Surgical History:  Procedure Laterality Date  . CESAREAN SECTION    . NECK SURGERY    . PARTIAL HYSTERECTOMY      Prior to Admission medications   Medication Sig Start Date End Date Taking? Authorizing Provider  amLODipine (NORVASC) 5 MG tablet Take 5 mg by  mouth daily. 12/27/17  Yes [provider]  metoprolol tartrate (LOPRESSOR) 25 MG tablet Take 25 mg by mouth 2 (two) times daily. 12/27/17  Yes [provider]  triamterene-hydrochlorothiazide (MAXZIDE-25) 37.5-25 MG tablet Take 1 tablet by mouth daily.   Yes [provider]    Family History  Problem Relation Age of Onset  . Cancer Mother        Breast cancer     Social History   Tobacco Use  . Smoking status: Never Smoker  . Smokeless tobacco: Never Used  Substance Use Topics  . Alcohol use: Never    Frequency: Never  . Drug use: Never    Allergies as of 04/08/2018  . (No Known Allergies)    Review of Systems:    All systems reviewed and negative except where noted in HPI.   Physical Exam:  BP 117/67   Pulse 84   Ht 5' 5.5" (1.664 m)   Wt 182 lb 6.4 oz (82.7 kg)   BMI 29.89 kg/m  No LMP recorded. Patient has had a hysterectomy. General:   Alert,  Well-developed, well-nourished, pleasant and cooperative in NAD Head:  Normocephalic and atraumatic. Eyes:  Sclera clear, no icterus.   Conjunctiva pink. Ears:  Normal auditory acuity. Nose:  No deformity, discharge, or lesions. Mouth:  No deformity  or lesions,oropharynx pink & moist. Neck:  Supple; no masses or thyromegaly. Lungs:  Respirations even and unlabored.  Clear throughout to auscultation.   No wheezes, crackles, or rhonchi. No acute distress. Heart:  Regular rate and rhythm; no murmurs, clicks, rubs, or gallops. Abdomen:  Normal bowel sounds.  No bruits.  Soft, non-tender and non-distended without masses, hepatosplenomegaly or hernias noted.  No guarding or rebound tenderness.  Negative Carnett sign.   Rectal:  Deferred.  Msk:  Symmetrical without gross deformities.  Good, equal movement & strength bilaterally. Pulses:  Normal pulses noted. Extremities:  No clubbing or edema.  No cyanosis. Neurologic:  Alert and oriented x3;  grossly normal neurologically. Skin:  Intact without  significant lesions or rashes.  No jaundice. Lymph Nodes:  No significant cervical adenopathy. Psych:  Alert and cooperative. Normal mood and affect.  Imaging Studies: No results found.  Assessment and Plan:   Melissa Vazquez is a 82 y.o. y/o female who comes today with a history of abnormal liver enzymes in May.  Previous to those blood draws the patient had normal liver enzymes back in March.  With the patient's labs also having thrombocytopenia I believe that this was likely a systemic process that caused her to have the abnormal labs. Whether it was a viral infection versus the medication she had brought over the Internet is unclear.  The patient's platelets have come back to normal and we will recheck the patient's liver enzymes today.  If the patient's liver enzymes are back to normal then the patient does not need any further follow-up of the abnormal liver enzymes.  The patient has been explained the plan and agrees with it.  Melissa Miniumarren Kolette Vey, MD. Clementeen GrahamFACG    Note: This dictation was prepared with Dragon dictation along with smaller phrase technology. Any transcriptional errors that result from this process are unintentional.

## 2018-04-09 ENCOUNTER — Telehealth: Payer: Self-pay

## 2018-04-09 DIAGNOSIS — I340 Nonrheumatic mitral (valve) insufficiency: Secondary | ICD-10-CM | POA: Diagnosis not present

## 2018-04-09 DIAGNOSIS — I10 Essential (primary) hypertension: Secondary | ICD-10-CM | POA: Diagnosis not present

## 2018-04-09 DIAGNOSIS — I517 Cardiomegaly: Secondary | ICD-10-CM | POA: Diagnosis not present

## 2018-04-09 DIAGNOSIS — E785 Hyperlipidemia, unspecified: Secondary | ICD-10-CM | POA: Diagnosis not present

## 2018-04-09 NOTE — Telephone Encounter (Signed)
-----   Message from Midge Miniumarren Wohl, MD sent at 04/08/2018  5:26 PM EDT ----- Let the patient know that her liver enzymes are completely back to normal and nothing further needs to be done to investigate her abnormal liver enzymes.

## 2018-04-09 NOTE — Telephone Encounter (Signed)
Pt notified of lab result. 

## 2018-04-22 DIAGNOSIS — E1122 Type 2 diabetes mellitus with diabetic chronic kidney disease: Secondary | ICD-10-CM | POA: Diagnosis not present

## 2018-04-22 DIAGNOSIS — I129 Hypertensive chronic kidney disease with stage 1 through stage 4 chronic kidney disease, or unspecified chronic kidney disease: Secondary | ICD-10-CM | POA: Diagnosis not present

## 2018-04-22 DIAGNOSIS — N183 Chronic kidney disease, stage 3 (moderate): Secondary | ICD-10-CM | POA: Diagnosis not present

## 2018-05-27 DIAGNOSIS — R809 Proteinuria, unspecified: Secondary | ICD-10-CM | POA: Diagnosis not present

## 2018-05-27 DIAGNOSIS — E1122 Type 2 diabetes mellitus with diabetic chronic kidney disease: Secondary | ICD-10-CM | POA: Diagnosis not present

## 2018-05-27 DIAGNOSIS — N183 Chronic kidney disease, stage 3 (moderate): Secondary | ICD-10-CM | POA: Diagnosis not present

## 2018-05-27 DIAGNOSIS — N2581 Secondary hyperparathyroidism of renal origin: Secondary | ICD-10-CM | POA: Diagnosis not present

## 2018-05-27 DIAGNOSIS — I129 Hypertensive chronic kidney disease with stage 1 through stage 4 chronic kidney disease, or unspecified chronic kidney disease: Secondary | ICD-10-CM | POA: Diagnosis not present

## 2018-06-16 ENCOUNTER — Inpatient Hospital Stay: Payer: Medicare Other | Attending: Internal Medicine

## 2018-06-16 ENCOUNTER — Encounter: Payer: Self-pay | Admitting: Internal Medicine

## 2018-06-16 ENCOUNTER — Inpatient Hospital Stay (HOSPITAL_BASED_OUTPATIENT_CLINIC_OR_DEPARTMENT_OTHER): Payer: Medicare Other | Admitting: Internal Medicine

## 2018-06-16 VITALS — BP 166/79 | HR 79 | Temp 97.6°F | Resp 16 | Wt 185.6 lb

## 2018-06-16 DIAGNOSIS — I129 Hypertensive chronic kidney disease with stage 1 through stage 4 chronic kidney disease, or unspecified chronic kidney disease: Secondary | ICD-10-CM | POA: Insufficient documentation

## 2018-06-16 DIAGNOSIS — D631 Anemia in chronic kidney disease: Secondary | ICD-10-CM

## 2018-06-16 DIAGNOSIS — N183 Chronic kidney disease, stage 3 unspecified: Secondary | ICD-10-CM

## 2018-06-16 DIAGNOSIS — E1122 Type 2 diabetes mellitus with diabetic chronic kidney disease: Secondary | ICD-10-CM | POA: Diagnosis not present

## 2018-06-16 LAB — COMPREHENSIVE METABOLIC PANEL WITH GFR
ALT: 14 U/L (ref 0–44)
AST: 20 U/L (ref 15–41)
Albumin: 4 g/dL (ref 3.5–5.0)
Alkaline Phosphatase: 53 U/L (ref 38–126)
Anion gap: 8 (ref 5–15)
BUN: 24 mg/dL — ABNORMAL HIGH (ref 8–23)
CO2: 26 mmol/L (ref 22–32)
Calcium: 9 mg/dL (ref 8.9–10.3)
Chloride: 106 mmol/L (ref 98–111)
Creatinine, Ser: 1.3 mg/dL — ABNORMAL HIGH (ref 0.44–1.00)
GFR calc Af Amer: 44 mL/min — ABNORMAL LOW
GFR calc non Af Amer: 38 mL/min — ABNORMAL LOW
Glucose, Bld: 96 mg/dL (ref 70–99)
Potassium: 3.2 mmol/L — ABNORMAL LOW (ref 3.5–5.1)
Sodium: 140 mmol/L (ref 135–145)
Total Bilirubin: 0.7 mg/dL (ref 0.3–1.2)
Total Protein: 7.5 g/dL (ref 6.5–8.1)

## 2018-06-16 LAB — CBC WITH DIFFERENTIAL/PLATELET
Basophils Absolute: 0 10*3/uL (ref 0–0.1)
Basophils Relative: 1 %
Eosinophils Absolute: 0.1 10*3/uL (ref 0–0.7)
Eosinophils Relative: 2 %
HEMATOCRIT: 32.5 % — AB (ref 35.0–47.0)
HEMOGLOBIN: 11.1 g/dL — AB (ref 12.0–16.0)
LYMPHS ABS: 3.1 10*3/uL (ref 1.0–3.6)
Lymphocytes Relative: 45 %
MCH: 30.8 pg (ref 26.0–34.0)
MCHC: 34.3 g/dL (ref 32.0–36.0)
MCV: 90 fL (ref 80.0–100.0)
MONO ABS: 0.6 10*3/uL (ref 0.2–0.9)
Monocytes Relative: 9 %
NEUTROS ABS: 3 10*3/uL (ref 1.4–6.5)
NEUTROS PCT: 43 %
Platelets: 229 10*3/uL (ref 150–440)
RBC: 3.61 MIL/uL — ABNORMAL LOW (ref 3.80–5.20)
RDW: 14.5 % (ref 11.5–14.5)
WBC: 6.8 10*3/uL (ref 3.6–11.0)

## 2018-06-16 NOTE — Patient Instructions (Signed)
#   take one pill every other day.

## 2018-06-16 NOTE — Progress Notes (Signed)
[facility] CONSULT NOTE  Patient Care Team: Lucia EstelleZheng, Feng, NP as PCP - General (Nurse Practitioner)  CHIEF COMPLAINTS/PURPOSE OF CONSULTATION:  Thrombocytopenia/anemia  # Mild anemia likely second to CKD  #CKD stage III-   No history exists.     HISTORY OF PRESENTING ILLNESS:  Melissa Vazquez 82 y.o.  female was evaluated in the hospital in May 2019 for thrombocytopenia/anemia/renal failure/fevers/elevated LFTs.  Patient significantly improved since the last hospitalization/visit.   Her appetite is good.  No weight loss but no nausea no vomiting.  Denies any significant fatigue.   Review of Systems  Constitutional: Negative for chills, diaphoresis, fever, malaise/fatigue and weight loss.  HENT: Negative for nosebleeds and sore throat.   Eyes: Negative for double vision.  Respiratory: Negative for cough, hemoptysis, sputum production, shortness of breath and wheezing.   Cardiovascular: Negative for chest pain, palpitations and orthopnea.  Gastrointestinal: Negative for abdominal pain, blood in stool, constipation, diarrhea, heartburn, melena, nausea and vomiting.  Genitourinary: Negative for dysuria, frequency and urgency.  Musculoskeletal: Negative for back pain and joint pain.  Skin: Negative.  Negative for itching and rash.  Neurological: Negative for dizziness, tingling, focal weakness, weakness and headaches.  Endo/Heme/Allergies: Does not bruise/bleed easily.  Psychiatric/Behavioral: Negative for depression. The patient is not nervous/anxious and does not have insomnia.      MEDICAL HISTORY:  Past Medical History:  Diagnosis Date  . Cardiomyopathy (HCC)   . CHF (congestive heart failure) (HCC)   . Diabetes mellitus without complication (HCC)    prediabetic  . Hypertension   . Varicose vein of leg     SURGICAL HISTORY: Past Surgical History:  Procedure Laterality Date  . CESAREAN SECTION    . NECK SURGERY    . PARTIAL HYSTERECTOMY      SOCIAL  HISTORY: Social History   Socioeconomic History  . Marital status: Single    Spouse name: Not on file  . Number of children: Not on file  . Years of education: Not on file  . Highest education level: Not on file  Occupational History  . Not on file  Social Needs  . Financial resource strain: Not on file  . Food insecurity:    Worry: Not on file    Inability: Not on file  . Transportation needs:    Medical: Not on file    Non-medical: Not on file  Tobacco Use  . Smoking status: Never Smoker  . Smokeless tobacco: Never Used  Substance and Sexual Activity  . Alcohol use: Never    Frequency: Never  . Drug use: Never  . Sexual activity: Not on file  Lifestyle  . Physical activity:    Days per week: Not on file    Minutes per session: Not on file  . Stress: Not on file  Relationships  . Social connections:    Talks on phone: Not on file    Gets together: Not on file    Attends religious service: Not on file    Active member of club or organization: Not on file    Attends meetings of clubs or organizations: Not on file    Relationship status: Not on file  . Intimate partner violence:    Fear of current or ex partner: Not on file    Emotionally abused: Not on file    Physically abused: Not on file    Forced sexual activity: Not on file  Other Topics Concern  . Not on file  Social History  Narrative  . Not on file    FAMILY HISTORY: Family History  Problem Relation Age of Onset  . Cancer Mother        Breast cancer    ALLERGIES:  has No Known Allergies.  MEDICATIONS:  Current Outpatient Medications  Medication Sig Dispense Refill  . amLODipine (NORVASC) 5 MG tablet Take 5 mg by mouth daily.    . metoprolol tartrate (LOPRESSOR) 25 MG tablet Take 25 mg by mouth 2 (two) times daily.    Marland Kitchen triamterene-hydrochlorothiazide (MAXZIDE-25) 37.5-25 MG tablet Take 1 tablet by mouth daily.     No current facility-administered medications for this visit.        Marland Kitchen  PHYSICAL EXAMINATION: ECOG PERFORMANCE STATUS: 0 - Asymptomatic  Vitals:   06/16/18 1459  BP: (!) 166/79  Pulse: 79  Resp: 16  Temp: 97.6 F (36.4 C)   Filed Weights   06/16/18 1459  Weight: 185 lb 9.6 oz (84.2 kg)    Physical Exam  Constitutional: She is oriented to person, place, and time and well-developed, well-nourished, and in no distress.  She is alone.  HENT:  Head: Normocephalic and atraumatic.  Mouth/Throat: Oropharynx is clear and moist. No oropharyngeal exudate.  Eyes: Pupils are equal, round, and reactive to light.  Neck: Normal range of motion. Neck supple.  Cardiovascular: Normal rate and regular rhythm.  Pulmonary/Chest: No respiratory distress. She has no wheezes.  Abdominal: Soft. Bowel sounds are normal. She exhibits no distension and no mass. There is no tenderness. There is no rebound and no guarding.  Musculoskeletal: Normal range of motion. She exhibits no edema.  Neurological: She is alert and oriented to person, place, and time.  Skin: Skin is warm.  Psychiatric: Affect normal.     LABORATORY DATA:  I have reviewed the data as listed Lab Results  Component Value Date   WBC 6.8 06/16/2018   HGB 11.1 (L) 06/16/2018   HCT 32.5 (L) 06/16/2018   MCV 90.0 06/16/2018   PLT 229 06/16/2018   Recent Labs    02/04/18 0242 02/10/18 1440 04/08/18 1611 06/16/18 1435  NA 137 139  --  140  K 3.9 4.0  --  3.2*  CL 108 107  --  106  CO2 23 23  --  26  GLUCOSE 91 88  --  96  BUN 16 15  --  24*  CREATININE 1.36* 1.18*  --  1.30*  CALCIUM 8.1* 8.8*  --  9.0  GFRNONAA 36* 43*  --  38*  GFRAA 42* 49*  --  44*  PROT 6.0* 7.3 7.8 7.5  ALBUMIN 2.8* 3.7 3.9 4.0  AST 120* 112* 25 20  ALT 106* 196* 18 14  ALKPHOS 70 83 63 53  BILITOT 0.5 0.6 0.8 0.7  BILIDIR  --   --  <0.1  --   IBILI  --   --  NOT CALCULATED  --     RADIOGRAPHIC STUDIES: I have personally reviewed the radiological images as listed and agreed with the findings in the  report. No results found.  ASSESSMENT & PLAN:   Anemia due to stage 3 chronic kidney disease (HCC) # Iron def anemia/ CKD- Hb 1 11.1 stable. Iron sat- 10%; continue PO iron.  No role for IV iron or Aranesp.  #CKD creatinine 1.3 stage III.  Stable.  # Follow up in 6 months/labs.   All questions were answered. The patient knows to call the clinic with any problems, questions or concerns.  Earna Coder, MD 06/16/2018 10:06 PM

## 2018-06-16 NOTE — Assessment & Plan Note (Addendum)
#   Iron def anemia/ CKD- Hb 1 11.1 stable. Iron sat- 10%; continue PO iron.  No role for IV iron or Aranesp.  #CKD creatinine 1.3 stage III.  Stable.  # Follow up in 6 months/labs.

## 2018-06-18 DIAGNOSIS — R7303 Prediabetes: Secondary | ICD-10-CM | POA: Diagnosis not present

## 2018-06-18 DIAGNOSIS — E782 Mixed hyperlipidemia: Secondary | ICD-10-CM | POA: Diagnosis not present

## 2018-06-18 DIAGNOSIS — I10 Essential (primary) hypertension: Secondary | ICD-10-CM | POA: Diagnosis not present

## 2018-06-18 DIAGNOSIS — Z79899 Other long term (current) drug therapy: Secondary | ICD-10-CM | POA: Diagnosis not present

## 2018-06-19 DIAGNOSIS — Z803 Family history of malignant neoplasm of breast: Secondary | ICD-10-CM | POA: Diagnosis not present

## 2018-06-19 DIAGNOSIS — Z1231 Encounter for screening mammogram for malignant neoplasm of breast: Secondary | ICD-10-CM | POA: Diagnosis not present

## 2018-06-23 DIAGNOSIS — E782 Mixed hyperlipidemia: Secondary | ICD-10-CM | POA: Diagnosis not present

## 2018-06-23 DIAGNOSIS — Z0000 Encounter for general adult medical examination without abnormal findings: Secondary | ICD-10-CM | POA: Diagnosis not present

## 2018-06-23 DIAGNOSIS — I10 Essential (primary) hypertension: Secondary | ICD-10-CM | POA: Diagnosis not present

## 2018-06-23 DIAGNOSIS — Z79899 Other long term (current) drug therapy: Secondary | ICD-10-CM | POA: Diagnosis not present

## 2018-06-23 DIAGNOSIS — R7303 Prediabetes: Secondary | ICD-10-CM | POA: Diagnosis not present

## 2018-07-22 IMAGING — MR MR HEAD W/O CM
10 series · 48 of 48 positions shown · non-contrast
Comparison: CT 02/01/2018

CLINICAL DATA: Acute presentation with mental status changes and
confusion.

EXAM:
MRI HEAD WITHOUT CONTRAST
TECHNIQUE: Multiplanar, multiecho pulse sequences of the brain and surrounding
structures were obtained without intravenous contrast.

[Series 2: T1 · sagittal · 5.0mm · 0.45mm/px · 2 of 23 slices shown (1 of 2)]
[im 1/23]
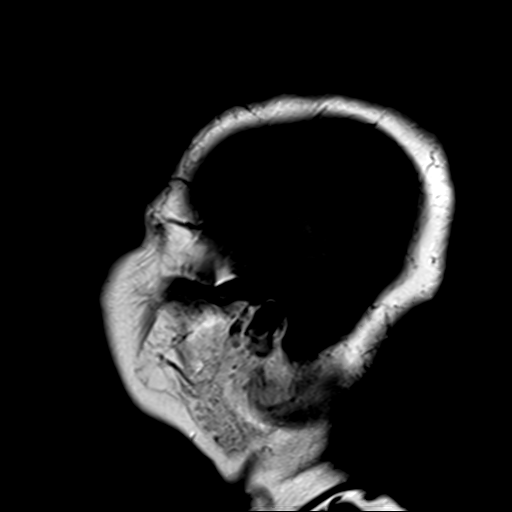
[im 23/23]
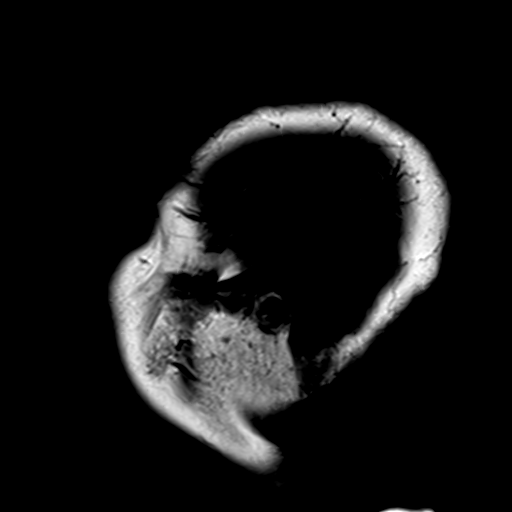

[Series 4: DWI · axial · 3.0mm · 1.80mm/px · z∈[-38,+120]mm · 5 of 55 slices shown (1 of 2)]
[im 1/55]
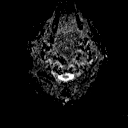
[im 14/55]
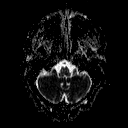
[im 28/55]
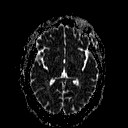
[im 41/55]
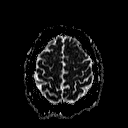
[im 55/55]
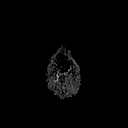

[Series 6: DWI · coronal · 3.0mm · 1.80mm/px · 4 of 46 slices shown (2 of 2)]
[im 1/46]
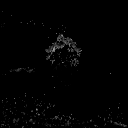
[im 16/46]
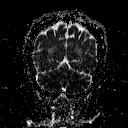
[im 31/46]
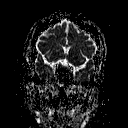
[im 46/46]
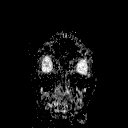

[Series 7: T2 · axial · 5.0mm · 0.60mm/px · z∈[-35,+117]mm · 2 of 25 slices shown (1 of 3)]
[im 1/25]
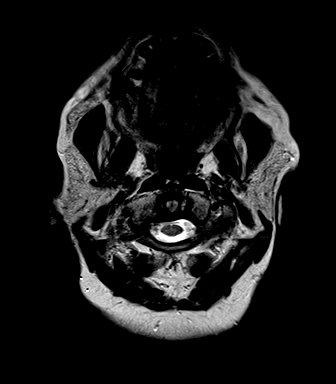
[im 25/25]
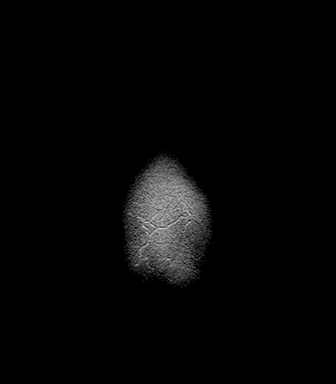

[Series 8: FLAIR · axial · 3.0mm · 0.45mm/px · z∈[-35,+117]mm · 5 of 53 slices shown]
[im 1/53]
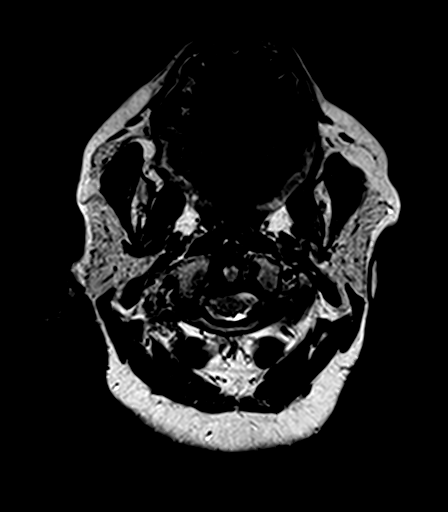
[im 14/53]
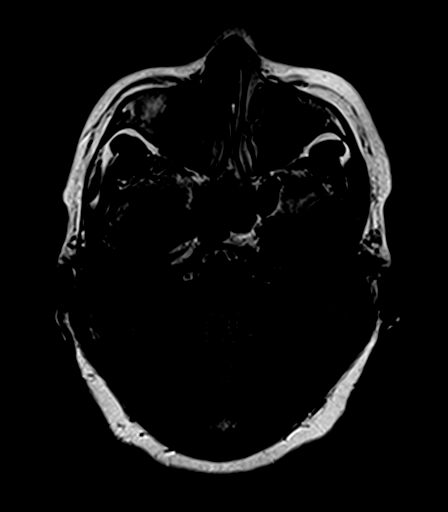
[im 27/53]
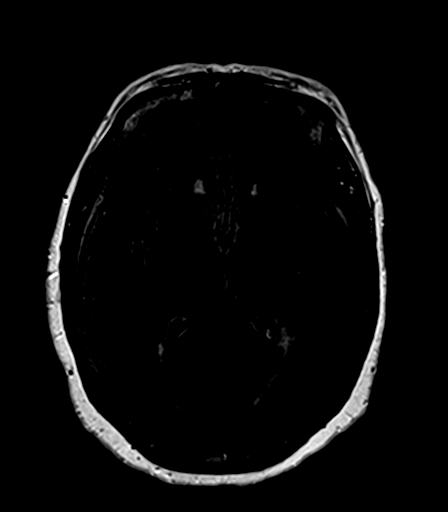
[im 40/53]
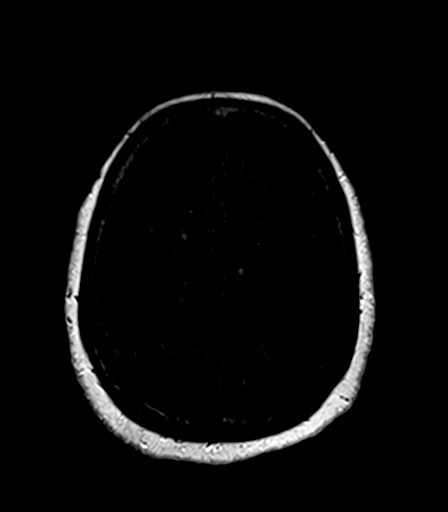
[im 53/53]
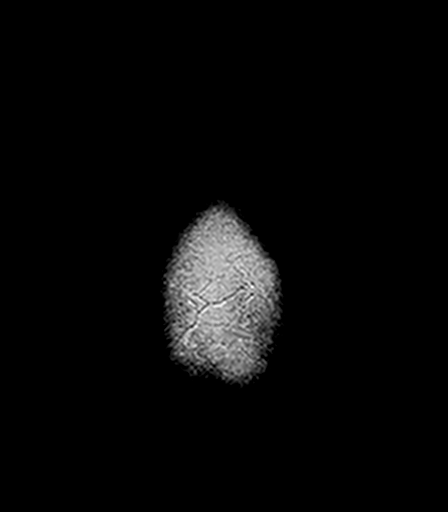

[Series 9: T2 · axial · 5.0mm · 0.45mm/px · z∈[-35,+117]mm · 2 of 25 slices shown (2 of 3)]
[im 1/25]
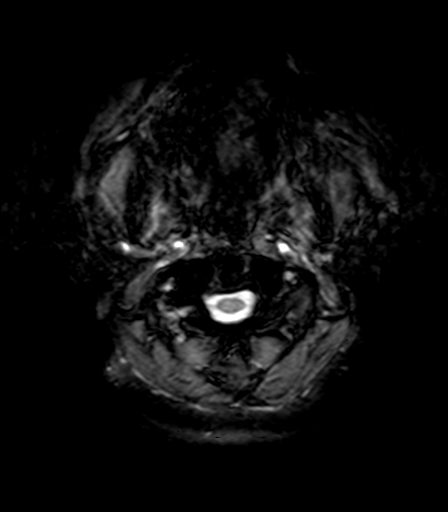
[im 25/25]
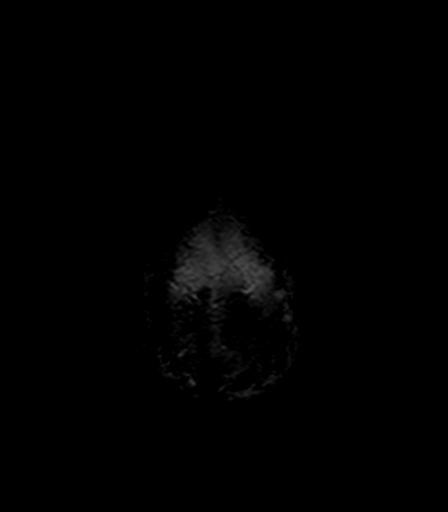

[Series 10: T1 · axial · 1.0mm · 1.00mm/px · z∈[-43,+128]mm · 16 of 176 slices shown (2 of 2)]
[im 1/176]
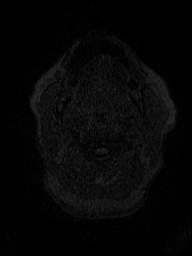
[im 12/176]
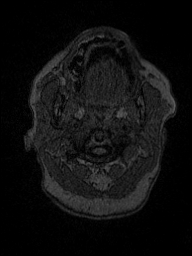
[im 24/176]
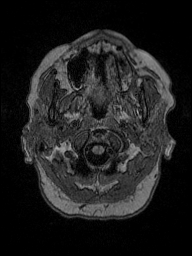
[im 36/176]
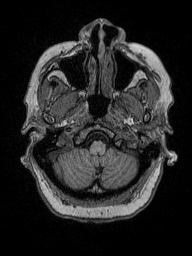
[im 47/176]
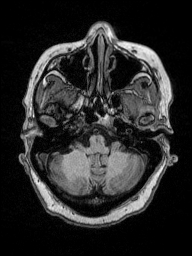
[im 59/176]
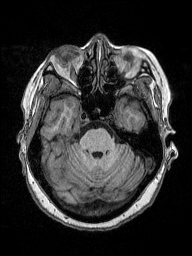
[im 71/176]
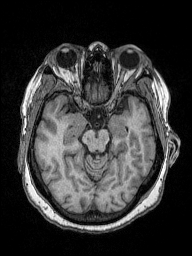
[im 82/176]
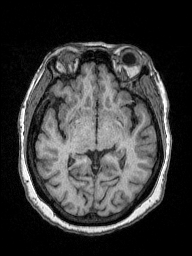
[im 94/176]
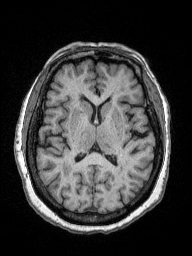
[im 106/176]
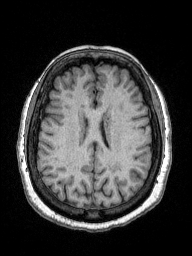
[im 117/176]
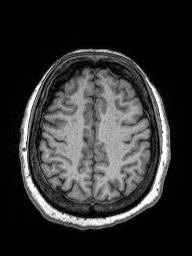
[im 129/176]
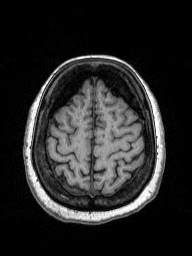
[im 141/176]
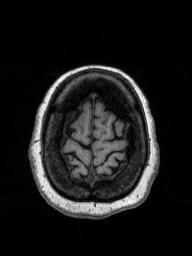
[im 152/176]
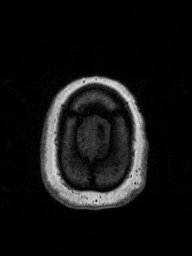
[im 164/176]
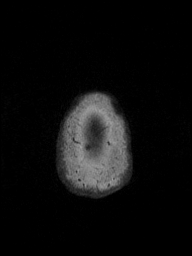
[im 176/176]
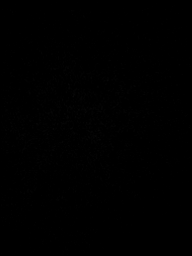

[Series 11: T2 · coronal · 5.0mm · 0.49mm/px · 3 of 29 slices shown (3 of 3)]
[im 1/29]
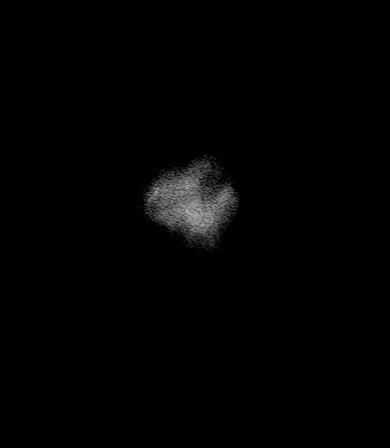
[im 15/29]
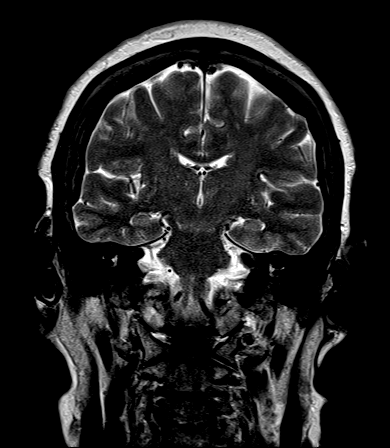
[im 29/29]
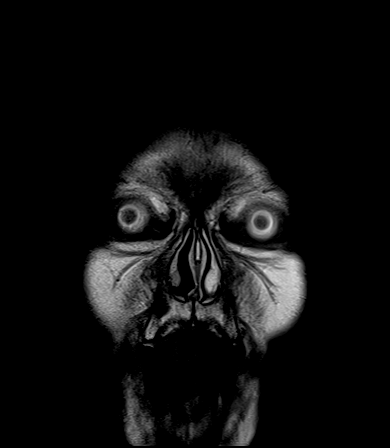

[Series 100: ax (id) · axial · 3.0mm · 1.80mm/px · z∈[-35,+120]mm · 5 of 53 slices shown]
[im 1/53]
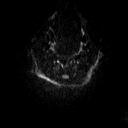
[im 14/53]
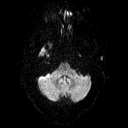
[im 27/53]
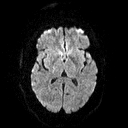
[im 40/53]
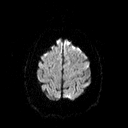
[im 53/53]
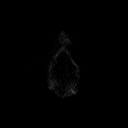

[Series 101: cor (id) · coronal · 3.0mm · 1.80mm/px · 4 of 43 slices shown]
[im 1/43]
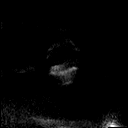
[im 15/43]
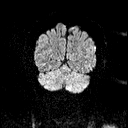
[im 29/43]
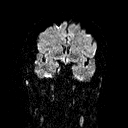
[im 43/43]
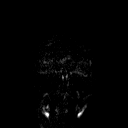

[48 of 48 positions shown; findings below may reference images not displayed]

FINDINGS: Brain: Diffusion imaging does not show any acute or subacute
infarction. The brainstem and cerebellum are normal. Cerebral
hemispheres show moderate chronic small-vessel ischemic changes of
the hemispheric white matter. No cortical or large vessel territory
infarction. No mass lesion, hemorrhage, hydrocephalus or extra-axial
collection.

Vascular: Major vessels at the base of the brain show flow.

Skull and upper cervical spine: Negative

Sinuses/Orbits: Clear/normal

Other: None
IMPRESSION: No acute or reversible finding. Moderate chronic small-vessel
ischemic changes of the cerebral hemispheric white matter.

## 2018-09-29 DIAGNOSIS — I10 Essential (primary) hypertension: Secondary | ICD-10-CM | POA: Diagnosis not present

## 2018-10-01 DIAGNOSIS — Z86010 Personal history of colonic polyps: Secondary | ICD-10-CM | POA: Diagnosis not present

## 2018-10-01 DIAGNOSIS — R4181 Age-related cognitive decline: Secondary | ICD-10-CM | POA: Diagnosis not present

## 2018-10-01 DIAGNOSIS — F0390 Unspecified dementia without behavioral disturbance: Secondary | ICD-10-CM | POA: Diagnosis not present

## 2018-10-13 DIAGNOSIS — R7303 Prediabetes: Secondary | ICD-10-CM | POA: Diagnosis not present

## 2018-10-13 DIAGNOSIS — I10 Essential (primary) hypertension: Secondary | ICD-10-CM | POA: Diagnosis not present

## 2018-10-13 DIAGNOSIS — E782 Mixed hyperlipidemia: Secondary | ICD-10-CM | POA: Diagnosis not present

## 2018-10-15 DIAGNOSIS — E785 Hyperlipidemia, unspecified: Secondary | ICD-10-CM | POA: Diagnosis not present

## 2018-10-15 DIAGNOSIS — R9431 Abnormal electrocardiogram [ECG] [EKG]: Secondary | ICD-10-CM | POA: Diagnosis not present

## 2018-10-15 DIAGNOSIS — I493 Ventricular premature depolarization: Secondary | ICD-10-CM | POA: Diagnosis not present

## 2018-10-15 DIAGNOSIS — I10 Essential (primary) hypertension: Secondary | ICD-10-CM | POA: Diagnosis not present

## 2018-10-15 DIAGNOSIS — I739 Peripheral vascular disease, unspecified: Secondary | ICD-10-CM | POA: Diagnosis not present

## 2018-10-20 DIAGNOSIS — R7303 Prediabetes: Secondary | ICD-10-CM | POA: Diagnosis not present

## 2018-10-20 DIAGNOSIS — Z79899 Other long term (current) drug therapy: Secondary | ICD-10-CM | POA: Diagnosis not present

## 2018-10-20 DIAGNOSIS — E782 Mixed hyperlipidemia: Secondary | ICD-10-CM | POA: Diagnosis not present

## 2018-10-20 DIAGNOSIS — I10 Essential (primary) hypertension: Secondary | ICD-10-CM | POA: Diagnosis not present

## 2018-10-21 DIAGNOSIS — E875 Hyperkalemia: Secondary | ICD-10-CM | POA: Diagnosis not present

## 2018-10-21 DIAGNOSIS — R809 Proteinuria, unspecified: Secondary | ICD-10-CM | POA: Diagnosis not present

## 2018-10-21 DIAGNOSIS — I129 Hypertensive chronic kidney disease with stage 1 through stage 4 chronic kidney disease, or unspecified chronic kidney disease: Secondary | ICD-10-CM | POA: Diagnosis not present

## 2018-10-21 DIAGNOSIS — N183 Chronic kidney disease, stage 3 (moderate): Secondary | ICD-10-CM | POA: Diagnosis not present

## 2018-12-15 ENCOUNTER — Ambulatory Visit: Payer: Medicare Other | Admitting: Internal Medicine

## 2018-12-15 ENCOUNTER — Other Ambulatory Visit: Payer: Medicare Other

## 2019-02-12 ENCOUNTER — Other Ambulatory Visit: Payer: Self-pay

## 2019-02-13 ENCOUNTER — Inpatient Hospital Stay (HOSPITAL_BASED_OUTPATIENT_CLINIC_OR_DEPARTMENT_OTHER): Payer: Medicare Other | Admitting: Internal Medicine

## 2019-02-13 ENCOUNTER — Other Ambulatory Visit: Payer: Self-pay | Admitting: *Deleted

## 2019-02-13 ENCOUNTER — Inpatient Hospital Stay: Payer: Medicare Other | Attending: Internal Medicine

## 2019-02-13 ENCOUNTER — Other Ambulatory Visit: Payer: Self-pay

## 2019-02-13 DIAGNOSIS — N183 Chronic kidney disease, stage 3 unspecified: Secondary | ICD-10-CM

## 2019-02-13 DIAGNOSIS — R5383 Other fatigue: Secondary | ICD-10-CM | POA: Diagnosis not present

## 2019-02-13 DIAGNOSIS — E611 Iron deficiency: Secondary | ICD-10-CM | POA: Diagnosis not present

## 2019-02-13 DIAGNOSIS — D631 Anemia in chronic kidney disease: Secondary | ICD-10-CM

## 2019-02-13 LAB — COMPREHENSIVE METABOLIC PANEL
ALT: 13 U/L (ref 0–44)
AST: 21 U/L (ref 15–41)
Albumin: 4.1 g/dL (ref 3.5–5.0)
Alkaline Phosphatase: 51 U/L (ref 38–126)
Anion gap: 8 (ref 5–15)
BUN: 26 mg/dL — ABNORMAL HIGH (ref 8–23)
CO2: 24 mmol/L (ref 22–32)
Calcium: 8.9 mg/dL (ref 8.9–10.3)
Chloride: 106 mmol/L (ref 98–111)
Creatinine, Ser: 1.43 mg/dL — ABNORMAL HIGH (ref 0.44–1.00)
GFR calc Af Amer: 40 mL/min — ABNORMAL LOW (ref >60)
GFR calc non Af Amer: 35 mL/min — ABNORMAL LOW (ref >60)
Glucose, Bld: 115 mg/dL — ABNORMAL HIGH (ref 70–99)
Potassium: 3.1 mmol/L — ABNORMAL LOW (ref 3.5–5.1)
Sodium: 138 mmol/L (ref 135–145)
Total Bilirubin: 0.6 mg/dL (ref 0.3–1.2)
Total Protein: 7.5 g/dL (ref 6.5–8.1)

## 2019-02-13 LAB — CBC WITH DIFFERENTIAL/PLATELET
Abs Immature Granulocytes: 0.02 10*3/uL (ref 0.00–0.07)
Basophils Absolute: 0 10*3/uL (ref 0.0–0.1)
Basophils Relative: 0 %
Eosinophils Absolute: 0.1 10*3/uL (ref 0.0–0.5)
Eosinophils Relative: 2 %
HCT: 31.7 % — ABNORMAL LOW (ref 36.0–46.0)
Hemoglobin: 10.7 g/dL — ABNORMAL LOW (ref 12.0–15.0)
Immature Granulocytes: 0 %
Lymphocytes Relative: 40 %
Lymphs Abs: 2.8 10*3/uL (ref 0.7–4.0)
MCH: 30.2 pg (ref 26.0–34.0)
MCHC: 33.8 g/dL (ref 30.0–36.0)
MCV: 89.5 fL (ref 80.0–100.0)
Monocytes Absolute: 0.7 10*3/uL (ref 0.1–1.0)
Monocytes Relative: 11 %
Neutro Abs: 3.3 10*3/uL (ref 1.7–7.7)
Neutrophils Relative %: 47 %
Platelets: 234 10*3/uL (ref 150–400)
RBC: 3.54 MIL/uL — ABNORMAL LOW (ref 3.87–5.11)
RDW: 13.8 % (ref 11.5–15.5)
WBC: 7 10*3/uL (ref 4.0–10.5)
nRBC: 0 % (ref 0.0–0.2)

## 2019-02-13 LAB — IRON AND TIBC
Iron: 52 ug/dL (ref 28–170)
Saturation Ratios: 13 % (ref 10.4–31.8)
TIBC: 399 ug/dL (ref 250–450)
UIBC: 347 ug/dL

## 2019-02-13 LAB — FERRITIN: Ferritin: 35 ng/mL (ref 11–307)

## 2019-02-13 NOTE — Progress Notes (Signed)
I connected with Carroll Sage on 02/13/19 at  2:45 PM EDT by telephone visit and verified that I am speaking with the correct person using two identifiers.  I discussed the limitations, risks, security and privacy concerns of performing an evaluation and management service by telemedicine and the availability of in-person appointments. I also discussed with the patient that there may be a patient responsible charge related to this service. The patient expressed understanding and agreed to proceed.    Other persons participating in the visit and their role in the encounter: none  Patient's location: home  Provider's location: home    No history exists.     Chief Complaint: anemia/CKD   History of present illness:Melissa Vazquez 83 y.o.  female with history of anemia/iron deficiency/chronic kidney disease is here for follow-up.  Patient denies any swelling in the legs.  denies any nausea vomiting.  Appetite is fair.  Mild fatigue.  She is not taking her iron pills every day. No blood in stools or black or stools.  Observation/objective: 10.6  Assessment and plan: Anemia due to stage 3 chronic kidney disease (HCC) # Iron def anemia/ CKD- Hb-10.7 stable.  Patient currently not taking p.o. iron.  Recommend iron once a day.  Iron studies needed pending.  Hold off IV iron this time.  #CKD creatinine 1.4 stage III. Stable [? Dr.Nephrology].   # DISPOSITION: # Follow up in 6 months-mD/labs- cbc/bmp/ldh/iron studies/ferritin- Dr.B    Follow-up instructions:  I discussed the assessment and treatment plan with the patient.  The patient was provided an opportunity to ask questions and all were answered.  The patient agreed with the plan and demonstrated understanding of instructions.  The patient was advised to call back or seek an in person evaluation if the symptoms worsen or if the condition fails to improve as anticipated.  I provided 12 minutes of non face-to-face telephone visit time  during this encounter, and > 50% was spent counseling as documented under my assessment & plan.   Dr. Louretta Shorten [facility] 02/13/2019 3:19 PM

## 2019-02-13 NOTE — Assessment & Plan Note (Addendum)
#   Iron def anemia/ CKD- Hb-10.7 stable.  Patient currently not taking p.o. iron.  Recommend iron once a day.  Iron studies needed pending.  Hold off IV iron this time.  #CKD creatinine 1.4 stage III. Stable [? Dr.Nephrology].   # DISPOSITION: # Follow up in 6 months-mD/labs- cbc/bmp/ldh/iron studies/ferritin- Dr.B 

## 2019-04-17 DIAGNOSIS — R7303 Prediabetes: Secondary | ICD-10-CM | POA: Diagnosis not present

## 2019-04-17 DIAGNOSIS — I10 Essential (primary) hypertension: Secondary | ICD-10-CM | POA: Diagnosis not present

## 2019-04-17 DIAGNOSIS — E782 Mixed hyperlipidemia: Secondary | ICD-10-CM | POA: Diagnosis not present

## 2019-05-01 DIAGNOSIS — E782 Mixed hyperlipidemia: Secondary | ICD-10-CM | POA: Diagnosis not present

## 2019-05-01 DIAGNOSIS — R7303 Prediabetes: Secondary | ICD-10-CM | POA: Diagnosis not present

## 2019-05-01 DIAGNOSIS — I10 Essential (primary) hypertension: Secondary | ICD-10-CM | POA: Diagnosis not present

## 2019-05-01 DIAGNOSIS — D649 Anemia, unspecified: Secondary | ICD-10-CM | POA: Diagnosis not present

## 2019-07-21 DIAGNOSIS — I10 Essential (primary) hypertension: Secondary | ICD-10-CM | POA: Diagnosis not present

## 2019-07-21 DIAGNOSIS — Z8249 Family history of ischemic heart disease and other diseases of the circulatory system: Secondary | ICD-10-CM | POA: Diagnosis not present

## 2019-07-21 DIAGNOSIS — Z1159 Encounter for screening for other viral diseases: Secondary | ICD-10-CM | POA: Diagnosis not present

## 2019-08-17 ENCOUNTER — Inpatient Hospital Stay: Payer: Medicare Other

## 2019-08-17 ENCOUNTER — Inpatient Hospital Stay: Payer: Medicare Other | Admitting: Internal Medicine

## 2019-08-28 ENCOUNTER — Inpatient Hospital Stay: Payer: Medicare Other | Admitting: Internal Medicine

## 2019-08-28 ENCOUNTER — Inpatient Hospital Stay: Payer: Medicare Other

## 2019-08-28 NOTE — Progress Notes (Unsigned)
[facility] NOTE  Patient Care Team: Barry Dienes, NP as PCP - General (Nurse Practitioner)  CHIEF COMPLAINTS/PURPOSE OF CONSULTATION:  Thrombocytopenia/anemia  # Mild anemia likely second to CKD  #CKD stage III-  Oncology History   No history exists.     HISTORY OF PRESENTING ILLNESS:  Melissa Vazquez 83 y.o.  female was evaluated in the hospital in May 2019 for thrombocytopenia/anemia/renal failure/fevers/elevated LFTs.  Patient significantly improved since the last hospitalization/visit.   Her appetite is good.  No weight loss but no nausea no vomiting.  Denies any significant fatigue.   Review of Systems  Constitutional: Negative for chills, diaphoresis, fever, malaise/fatigue and weight loss.  HENT: Negative for nosebleeds and sore throat.   Eyes: Negative for double vision.  Respiratory: Negative for cough, hemoptysis, sputum production, shortness of breath and wheezing.   Cardiovascular: Negative for chest pain, palpitations and orthopnea.  Gastrointestinal: Negative for abdominal pain, blood in stool, constipation, diarrhea, heartburn, melena, nausea and vomiting.  Genitourinary: Negative for dysuria, frequency and urgency.  Musculoskeletal: Negative for back pain and joint pain.  Skin: Negative.  Negative for itching and rash.  Neurological: Negative for dizziness, tingling, focal weakness, weakness and headaches.  Endo/Heme/Allergies: Does not bruise/bleed easily.  Psychiatric/Behavioral: Negative for depression. The patient is not nervous/anxious and does not have insomnia.      MEDICAL HISTORY:  Past Medical History:  Diagnosis Date  . Cardiomyopathy ([place])   . CHF (congestive heart failure) ([place])   . Diabetes mellitus without complication (HCC)    prediabetic  . Hypertension   . Varicose vein of leg     SURGICAL HISTORY: Past Surgical History:  Procedure Laterality Date  . CESAREAN SECTION    . NECK SURGERY    . PARTIAL  HYSTERECTOMY      SOCIAL HISTORY: Social History   Socioeconomic History  . Marital status: Single    Spouse name: Not on file  . Number of children: Not on file  . Years of education: Not on file  . Highest education level: Not on file  Occupational History  . Not on file  Social Needs  . Financial resource strain: Not on file  . Food insecurity    Worry: Not on file    Inability: Not on file  . Transportation needs    Medical: Not on file    Non-medical: Not on file  Tobacco Use  . Smoking status: Never Smoker  . Smokeless tobacco: Never Used  Substance and Sexual Activity  . Alcohol use: Never    Frequency: Never  . Drug use: Never  . Sexual activity: Not on file  Lifestyle  . Physical activity    Days per week: Not on file    Minutes per session: Not on file  . Stress: Not on file  Relationships  . Social Herbalist on phone: Not on file    Gets together: Not on file    Attends religious service: Not on file    Active member of club or organization: Not on file    Attends meetings of clubs or organizations: Not on file    Relationship status: Not on file  . Intimate partner violence    Fear of current or ex partner: Not on file    Emotionally abused: Not on file    Physically abused: Not on file    Forced sexual activity: Not on file  Other Topics Concern  . Not on file  Social History Narrative  . Not on file    FAMILY HISTORY: Family History  Problem Relation Age of Onset  . Cancer Mother        Breast cancer    ALLERGIES:  has No Known Allergies.  MEDICATIONS:  Current Outpatient Medications  Medication Sig Dispense Refill  . amLODipine (NORVASC) 5 MG tablet Take 5 mg by mouth daily.    . metoprolol tartrate (LOPRESSOR) 25 MG tablet Take 25 mg by mouth 2 (two) times daily.    Marland Kitchen triamterene-hydrochlorothiazide (MAXZIDE-25) 37.5-25 MG tablet Take 1 tablet by mouth daily.     No current facility-administered medications for this  visit.       Marland Kitchen  PHYSICAL EXAMINATION: ECOG PERFORMANCE STATUS: 0 - Asymptomatic  There were no vitals filed for this visit. There were no vitals filed for this visit.  Physical Exam  Constitutional: She is oriented to person, place, and time and well-developed, well-nourished, and in no distress.  She is alone.  HENT:  Head: Normocephalic and atraumatic.  Mouth/Throat: Oropharynx is clear and moist. No oropharyngeal exudate.  Eyes: Pupils are equal, round, and reactive to light.  Neck: Normal range of motion. Neck supple.  Cardiovascular: Normal rate and regular rhythm.  Pulmonary/Chest: No respiratory distress. She has no wheezes.  Abdominal: Soft. Bowel sounds are normal. She exhibits no distension and no mass. There is no abdominal tenderness. There is no rebound and no guarding.  Musculoskeletal: Normal range of motion.        General: No edema.  Neurological: She is alert and oriented to person, place, and time.  Skin: Skin is warm.  Psychiatric: Affect normal.     LABORATORY DATA:  I have reviewed the data as listed Lab Results  Component Value Date   WBC 7.0 02/13/2019   HGB 10.7 (L) 02/13/2019   HCT 31.7 (L) 02/13/2019   MCV 89.5 02/13/2019   PLT 234 02/13/2019   Recent Labs    02/13/19 1332  NA 138  K 3.1*  CL 106  CO2 24  GLUCOSE 115*  BUN 26*  CREATININE 1.43*  CALCIUM 8.9  GFRNONAA 35*  GFRAA 40*  PROT 7.5  ALBUMIN 4.1  AST 21  ALT 13  ALKPHOS 51  BILITOT 0.6    RADIOGRAPHIC STUDIES: I have personally reviewed the radiological images as listed and agreed with the findings in the report. No results found.  ASSESSMENT & PLAN:   No problem-specific Assessment & Plan notes found for this encounter.  All questions were answered. The patient knows to call the clinic with any problems, questions or concerns.    Earna Coder, MD 08/28/2019 8:22 AM

## 2019-08-28 NOTE — Assessment & Plan Note (Signed)
#   Iron def anemia/ CKD- Hb-10.7 stable.  Patient currently not taking p.o. iron.  Recommend iron once a day.  Iron studies needed pending.  Hold off IV iron this time.  #CKD creatinine 1.4 stage III. Stable [? Dr.Nephrology].   # DISPOSITION: # Follow up in 6 months-mD/labs- cbc/bmp/ldh/iron studies/ferritin- Dr.B

## 2019-10-09 DIAGNOSIS — R42 Dizziness and giddiness: Secondary | ICD-10-CM | POA: Diagnosis not present

## 2019-10-09 DIAGNOSIS — G3184 Mild cognitive impairment, so stated: Secondary | ICD-10-CM | POA: Diagnosis not present

## 2019-10-09 DIAGNOSIS — B0229 Other postherpetic nervous system involvement: Secondary | ICD-10-CM | POA: Diagnosis not present

## 2019-10-09 DIAGNOSIS — Z79899 Other long term (current) drug therapy: Secondary | ICD-10-CM | POA: Diagnosis not present

## 2019-10-09 DIAGNOSIS — N1831 Chronic kidney disease, stage 3a: Secondary | ICD-10-CM | POA: Diagnosis not present

## 2019-10-18 DIAGNOSIS — N1831 Chronic kidney disease, stage 3a: Secondary | ICD-10-CM | POA: Diagnosis not present

## 2019-10-18 DIAGNOSIS — I509 Heart failure, unspecified: Secondary | ICD-10-CM | POA: Diagnosis not present

## 2019-10-18 DIAGNOSIS — B0229 Other postherpetic nervous system involvement: Secondary | ICD-10-CM | POA: Diagnosis not present

## 2019-10-18 DIAGNOSIS — I130 Hypertensive heart and chronic kidney disease with heart failure and stage 1 through stage 4 chronic kidney disease, or unspecified chronic kidney disease: Secondary | ICD-10-CM | POA: Diagnosis not present

## 2019-10-18 DIAGNOSIS — I429 Cardiomyopathy, unspecified: Secondary | ICD-10-CM | POA: Diagnosis not present

## 2019-10-18 DIAGNOSIS — G3184 Mild cognitive impairment, so stated: Secondary | ICD-10-CM | POA: Diagnosis not present

## 2019-10-18 DIAGNOSIS — Z9181 History of falling: Secondary | ICD-10-CM | POA: Diagnosis not present

## 2019-10-18 DIAGNOSIS — E1122 Type 2 diabetes mellitus with diabetic chronic kidney disease: Secondary | ICD-10-CM | POA: Diagnosis not present

## 2019-10-21 DIAGNOSIS — I429 Cardiomyopathy, unspecified: Secondary | ICD-10-CM | POA: Diagnosis not present

## 2019-10-21 DIAGNOSIS — E1122 Type 2 diabetes mellitus with diabetic chronic kidney disease: Secondary | ICD-10-CM | POA: Diagnosis not present

## 2019-10-21 DIAGNOSIS — I130 Hypertensive heart and chronic kidney disease with heart failure and stage 1 through stage 4 chronic kidney disease, or unspecified chronic kidney disease: Secondary | ICD-10-CM | POA: Diagnosis not present

## 2019-10-21 DIAGNOSIS — I509 Heart failure, unspecified: Secondary | ICD-10-CM | POA: Diagnosis not present

## 2019-10-21 DIAGNOSIS — B0229 Other postherpetic nervous system involvement: Secondary | ICD-10-CM | POA: Diagnosis not present

## 2019-10-21 DIAGNOSIS — N1831 Chronic kidney disease, stage 3a: Secondary | ICD-10-CM | POA: Diagnosis not present

## 2019-10-23 DIAGNOSIS — N1831 Chronic kidney disease, stage 3a: Secondary | ICD-10-CM | POA: Diagnosis not present

## 2019-10-23 DIAGNOSIS — I130 Hypertensive heart and chronic kidney disease with heart failure and stage 1 through stage 4 chronic kidney disease, or unspecified chronic kidney disease: Secondary | ICD-10-CM | POA: Diagnosis not present

## 2019-10-23 DIAGNOSIS — I429 Cardiomyopathy, unspecified: Secondary | ICD-10-CM | POA: Diagnosis not present

## 2019-10-23 DIAGNOSIS — I509 Heart failure, unspecified: Secondary | ICD-10-CM | POA: Diagnosis not present

## 2019-10-23 DIAGNOSIS — E1122 Type 2 diabetes mellitus with diabetic chronic kidney disease: Secondary | ICD-10-CM | POA: Diagnosis not present

## 2019-10-23 DIAGNOSIS — B0229 Other postherpetic nervous system involvement: Secondary | ICD-10-CM | POA: Diagnosis not present

## 2019-10-28 DIAGNOSIS — I130 Hypertensive heart and chronic kidney disease with heart failure and stage 1 through stage 4 chronic kidney disease, or unspecified chronic kidney disease: Secondary | ICD-10-CM | POA: Diagnosis not present

## 2019-10-28 DIAGNOSIS — B0229 Other postherpetic nervous system involvement: Secondary | ICD-10-CM | POA: Diagnosis not present

## 2019-10-28 DIAGNOSIS — I509 Heart failure, unspecified: Secondary | ICD-10-CM | POA: Diagnosis not present

## 2019-10-28 DIAGNOSIS — N1831 Chronic kidney disease, stage 3a: Secondary | ICD-10-CM | POA: Diagnosis not present

## 2019-10-28 DIAGNOSIS — E1122 Type 2 diabetes mellitus with diabetic chronic kidney disease: Secondary | ICD-10-CM | POA: Diagnosis not present

## 2019-10-28 DIAGNOSIS — I429 Cardiomyopathy, unspecified: Secondary | ICD-10-CM | POA: Diagnosis not present

## 2019-10-30 DIAGNOSIS — I130 Hypertensive heart and chronic kidney disease with heart failure and stage 1 through stage 4 chronic kidney disease, or unspecified chronic kidney disease: Secondary | ICD-10-CM | POA: Diagnosis not present

## 2019-10-30 DIAGNOSIS — N1831 Chronic kidney disease, stage 3a: Secondary | ICD-10-CM | POA: Diagnosis not present

## 2019-10-30 DIAGNOSIS — E1122 Type 2 diabetes mellitus with diabetic chronic kidney disease: Secondary | ICD-10-CM | POA: Diagnosis not present

## 2019-10-30 DIAGNOSIS — I429 Cardiomyopathy, unspecified: Secondary | ICD-10-CM | POA: Diagnosis not present

## 2019-10-30 DIAGNOSIS — I509 Heart failure, unspecified: Secondary | ICD-10-CM | POA: Diagnosis not present

## 2019-10-30 DIAGNOSIS — B0229 Other postherpetic nervous system involvement: Secondary | ICD-10-CM | POA: Diagnosis not present

## 2019-11-05 DIAGNOSIS — I130 Hypertensive heart and chronic kidney disease with heart failure and stage 1 through stage 4 chronic kidney disease, or unspecified chronic kidney disease: Secondary | ICD-10-CM | POA: Diagnosis not present

## 2019-11-05 DIAGNOSIS — N1831 Chronic kidney disease, stage 3a: Secondary | ICD-10-CM | POA: Diagnosis not present

## 2019-11-05 DIAGNOSIS — I509 Heart failure, unspecified: Secondary | ICD-10-CM | POA: Diagnosis not present

## 2019-11-05 DIAGNOSIS — I429 Cardiomyopathy, unspecified: Secondary | ICD-10-CM | POA: Diagnosis not present

## 2019-11-05 DIAGNOSIS — B0229 Other postherpetic nervous system involvement: Secondary | ICD-10-CM | POA: Diagnosis not present

## 2019-11-05 DIAGNOSIS — E1122 Type 2 diabetes mellitus with diabetic chronic kidney disease: Secondary | ICD-10-CM | POA: Diagnosis not present

## 2019-11-06 DIAGNOSIS — B0229 Other postherpetic nervous system involvement: Secondary | ICD-10-CM | POA: Diagnosis not present

## 2019-11-06 DIAGNOSIS — I509 Heart failure, unspecified: Secondary | ICD-10-CM | POA: Diagnosis not present

## 2019-11-06 DIAGNOSIS — E1122 Type 2 diabetes mellitus with diabetic chronic kidney disease: Secondary | ICD-10-CM | POA: Diagnosis not present

## 2019-11-06 DIAGNOSIS — N1831 Chronic kidney disease, stage 3a: Secondary | ICD-10-CM | POA: Diagnosis not present

## 2019-11-06 DIAGNOSIS — I429 Cardiomyopathy, unspecified: Secondary | ICD-10-CM | POA: Diagnosis not present

## 2019-11-06 DIAGNOSIS — I130 Hypertensive heart and chronic kidney disease with heart failure and stage 1 through stage 4 chronic kidney disease, or unspecified chronic kidney disease: Secondary | ICD-10-CM | POA: Diagnosis not present

## 2019-11-11 DIAGNOSIS — Z23 Encounter for immunization: Secondary | ICD-10-CM | POA: Diagnosis not present

## 2019-11-11 DIAGNOSIS — B0229 Other postherpetic nervous system involvement: Secondary | ICD-10-CM | POA: Diagnosis not present

## 2019-11-11 DIAGNOSIS — I429 Cardiomyopathy, unspecified: Secondary | ICD-10-CM | POA: Diagnosis not present

## 2019-11-11 DIAGNOSIS — E1122 Type 2 diabetes mellitus with diabetic chronic kidney disease: Secondary | ICD-10-CM | POA: Diagnosis not present

## 2019-11-11 DIAGNOSIS — I509 Heart failure, unspecified: Secondary | ICD-10-CM | POA: Diagnosis not present

## 2019-11-11 DIAGNOSIS — I130 Hypertensive heart and chronic kidney disease with heart failure and stage 1 through stage 4 chronic kidney disease, or unspecified chronic kidney disease: Secondary | ICD-10-CM | POA: Diagnosis not present

## 2019-11-11 DIAGNOSIS — N1831 Chronic kidney disease, stage 3a: Secondary | ICD-10-CM | POA: Diagnosis not present

## 2019-11-13 DIAGNOSIS — E1122 Type 2 diabetes mellitus with diabetic chronic kidney disease: Secondary | ICD-10-CM | POA: Diagnosis not present

## 2019-11-13 DIAGNOSIS — N1831 Chronic kidney disease, stage 3a: Secondary | ICD-10-CM | POA: Diagnosis not present

## 2019-11-13 DIAGNOSIS — B0229 Other postherpetic nervous system involvement: Secondary | ICD-10-CM | POA: Diagnosis not present

## 2019-11-13 DIAGNOSIS — I429 Cardiomyopathy, unspecified: Secondary | ICD-10-CM | POA: Diagnosis not present

## 2019-11-13 DIAGNOSIS — I509 Heart failure, unspecified: Secondary | ICD-10-CM | POA: Diagnosis not present

## 2019-11-13 DIAGNOSIS — I130 Hypertensive heart and chronic kidney disease with heart failure and stage 1 through stage 4 chronic kidney disease, or unspecified chronic kidney disease: Secondary | ICD-10-CM | POA: Diagnosis not present

## 2019-11-17 DIAGNOSIS — I130 Hypertensive heart and chronic kidney disease with heart failure and stage 1 through stage 4 chronic kidney disease, or unspecified chronic kidney disease: Secondary | ICD-10-CM | POA: Diagnosis not present

## 2019-11-17 DIAGNOSIS — I429 Cardiomyopathy, unspecified: Secondary | ICD-10-CM | POA: Diagnosis not present

## 2019-11-17 DIAGNOSIS — N1831 Chronic kidney disease, stage 3a: Secondary | ICD-10-CM | POA: Diagnosis not present

## 2019-11-17 DIAGNOSIS — I509 Heart failure, unspecified: Secondary | ICD-10-CM | POA: Diagnosis not present

## 2019-11-17 DIAGNOSIS — B0229 Other postherpetic nervous system involvement: Secondary | ICD-10-CM | POA: Diagnosis not present

## 2019-11-17 DIAGNOSIS — E1122 Type 2 diabetes mellitus with diabetic chronic kidney disease: Secondary | ICD-10-CM | POA: Diagnosis not present

## 2019-11-17 DIAGNOSIS — Z9181 History of falling: Secondary | ICD-10-CM | POA: Diagnosis not present

## 2019-11-17 DIAGNOSIS — G3184 Mild cognitive impairment, so stated: Secondary | ICD-10-CM | POA: Diagnosis not present

## 2019-11-18 DIAGNOSIS — B0229 Other postherpetic nervous system involvement: Secondary | ICD-10-CM | POA: Diagnosis not present

## 2019-11-18 DIAGNOSIS — N1831 Chronic kidney disease, stage 3a: Secondary | ICD-10-CM | POA: Diagnosis not present

## 2019-11-18 DIAGNOSIS — E1122 Type 2 diabetes mellitus with diabetic chronic kidney disease: Secondary | ICD-10-CM | POA: Diagnosis not present

## 2019-11-18 DIAGNOSIS — I130 Hypertensive heart and chronic kidney disease with heart failure and stage 1 through stage 4 chronic kidney disease, or unspecified chronic kidney disease: Secondary | ICD-10-CM | POA: Diagnosis not present

## 2019-11-18 DIAGNOSIS — I429 Cardiomyopathy, unspecified: Secondary | ICD-10-CM | POA: Diagnosis not present

## 2019-11-18 DIAGNOSIS — I509 Heart failure, unspecified: Secondary | ICD-10-CM | POA: Diagnosis not present

## 2019-11-24 DIAGNOSIS — B0229 Other postherpetic nervous system involvement: Secondary | ICD-10-CM | POA: Diagnosis not present

## 2019-11-24 DIAGNOSIS — Z79899 Other long term (current) drug therapy: Secondary | ICD-10-CM | POA: Diagnosis not present

## 2019-11-24 DIAGNOSIS — R7309 Other abnormal glucose: Secondary | ICD-10-CM | POA: Diagnosis not present

## 2019-11-24 DIAGNOSIS — I10 Essential (primary) hypertension: Secondary | ICD-10-CM | POA: Diagnosis not present

## 2019-11-25 DIAGNOSIS — E1122 Type 2 diabetes mellitus with diabetic chronic kidney disease: Secondary | ICD-10-CM | POA: Diagnosis not present

## 2019-11-25 DIAGNOSIS — N1831 Chronic kidney disease, stage 3a: Secondary | ICD-10-CM | POA: Diagnosis not present

## 2019-11-25 DIAGNOSIS — I130 Hypertensive heart and chronic kidney disease with heart failure and stage 1 through stage 4 chronic kidney disease, or unspecified chronic kidney disease: Secondary | ICD-10-CM | POA: Diagnosis not present

## 2019-11-25 DIAGNOSIS — I429 Cardiomyopathy, unspecified: Secondary | ICD-10-CM | POA: Diagnosis not present

## 2019-11-25 DIAGNOSIS — I509 Heart failure, unspecified: Secondary | ICD-10-CM | POA: Diagnosis not present

## 2019-11-25 DIAGNOSIS — B0229 Other postherpetic nervous system involvement: Secondary | ICD-10-CM | POA: Diagnosis not present

## 2019-12-01 DIAGNOSIS — Z23 Encounter for immunization: Secondary | ICD-10-CM | POA: Diagnosis not present

## 2019-12-11 DIAGNOSIS — R590 Localized enlarged lymph nodes: Secondary | ICD-10-CM | POA: Diagnosis not present

## 2020-02-05 DIAGNOSIS — Z0000 Encounter for general adult medical examination without abnormal findings: Secondary | ICD-10-CM | POA: Diagnosis not present

## 2020-02-05 DIAGNOSIS — N183 Chronic kidney disease, stage 3 unspecified: Secondary | ICD-10-CM | POA: Diagnosis not present

## 2020-02-05 DIAGNOSIS — R7303 Prediabetes: Secondary | ICD-10-CM | POA: Diagnosis not present

## 2020-02-05 DIAGNOSIS — Z87898 Personal history of other specified conditions: Secondary | ICD-10-CM | POA: Diagnosis not present

## 2020-02-05 DIAGNOSIS — G3184 Mild cognitive impairment, so stated: Secondary | ICD-10-CM | POA: Diagnosis not present

## 2020-02-05 DIAGNOSIS — M7989 Other specified soft tissue disorders: Secondary | ICD-10-CM | POA: Diagnosis not present

## 2020-02-05 DIAGNOSIS — Z1231 Encounter for screening mammogram for malignant neoplasm of breast: Secondary | ICD-10-CM | POA: Diagnosis not present

## 2020-02-05 DIAGNOSIS — B029 Zoster without complications: Secondary | ICD-10-CM | POA: Diagnosis not present

## 2020-02-05 DIAGNOSIS — D631 Anemia in chronic kidney disease: Secondary | ICD-10-CM | POA: Diagnosis not present

## 2020-02-05 DIAGNOSIS — H6123 Impacted cerumen, bilateral: Secondary | ICD-10-CM | POA: Diagnosis not present

## 2020-02-05 DIAGNOSIS — I10 Essential (primary) hypertension: Secondary | ICD-10-CM | POA: Diagnosis not present

## 2020-03-23 DIAGNOSIS — N6021 Fibroadenosis of right breast: Secondary | ICD-10-CM | POA: Diagnosis not present

## 2020-03-23 DIAGNOSIS — N6031 Fibrosclerosis of right breast: Secondary | ICD-10-CM | POA: Diagnosis not present

## 2020-05-06 DIAGNOSIS — N183 Chronic kidney disease, stage 3 unspecified: Secondary | ICD-10-CM | POA: Diagnosis not present

## 2020-05-06 DIAGNOSIS — F09 Unspecified mental disorder due to known physiological condition: Secondary | ICD-10-CM | POA: Diagnosis not present

## 2020-05-06 DIAGNOSIS — I129 Hypertensive chronic kidney disease with stage 1 through stage 4 chronic kidney disease, or unspecified chronic kidney disease: Secondary | ICD-10-CM | POA: Diagnosis not present

## 2020-07-08 DIAGNOSIS — Z23 Encounter for immunization: Secondary | ICD-10-CM | POA: Diagnosis not present

## 2020-08-22 DIAGNOSIS — Z23 Encounter for immunization: Secondary | ICD-10-CM | POA: Diagnosis not present

## 2020-09-14 DIAGNOSIS — R609 Edema, unspecified: Secondary | ICD-10-CM | POA: Diagnosis not present

## 2020-09-14 DIAGNOSIS — S93402A Sprain of unspecified ligament of left ankle, initial encounter: Secondary | ICD-10-CM | POA: Diagnosis not present

## 2020-09-14 DIAGNOSIS — M7732 Calcaneal spur, left foot: Secondary | ICD-10-CM | POA: Diagnosis not present

## 2020-09-14 DIAGNOSIS — M109 Gout, unspecified: Secondary | ICD-10-CM | POA: Diagnosis not present
